# Patient Record
Sex: Male | Born: 1945 | Race: Black or African American | Hispanic: No | Marital: Married | Smoking: Former smoker
Health system: Southern US, Community
[De-identification: ages and names within clinical notes are randomized; demographics above are authoritative.]

## PROBLEM LIST (undated history)

## (undated) DIAGNOSIS — I10 Essential (primary) hypertension: Secondary | ICD-10-CM

## (undated) DIAGNOSIS — D696 Thrombocytopenia, unspecified: Secondary | ICD-10-CM

## (undated) DIAGNOSIS — J45909 Unspecified asthma, uncomplicated: Secondary | ICD-10-CM

## (undated) DIAGNOSIS — E119 Type 2 diabetes mellitus without complications: Secondary | ICD-10-CM

## (undated) DIAGNOSIS — E039 Hypothyroidism, unspecified: Secondary | ICD-10-CM

## (undated) DIAGNOSIS — M1990 Unspecified osteoarthritis, unspecified site: Secondary | ICD-10-CM

## (undated) DIAGNOSIS — E538 Deficiency of other specified B group vitamins: Secondary | ICD-10-CM

## (undated) DIAGNOSIS — A048 Other specified bacterial intestinal infections: Secondary | ICD-10-CM

## (undated) DIAGNOSIS — E785 Hyperlipidemia, unspecified: Secondary | ICD-10-CM

## (undated) DIAGNOSIS — Z86010 Personal history of colonic polyps: Secondary | ICD-10-CM

---

## 2012-04-28 HISTORY — PX: COLONOSCOPY: SHX174

## 2015-08-09 DIAGNOSIS — L02611 Cutaneous abscess of right foot: Secondary | ICD-10-CM | POA: Diagnosis not present

## 2015-11-09 DIAGNOSIS — Z8639 Personal history of other endocrine, nutritional and metabolic disease: Secondary | ICD-10-CM | POA: Diagnosis not present

## 2015-11-23 ENCOUNTER — Ambulatory Visit (INDEPENDENT_AMBULATORY_CARE_PROVIDER_SITE_OTHER): Payer: Self-pay | Admitting: Internal Medicine

## 2015-11-23 DIAGNOSIS — Z9189 Other specified personal risk factors, not elsewhere classified: Secondary | ICD-10-CM | POA: Diagnosis not present

## 2015-11-23 LAB — HEPATITIS A ANTIBODY, TOTAL: Hep A Total Ab: NONREACTIVE

## 2015-11-23 MED ORDER — TYPHOID VACCINE PO CPDR: 1.0000 | DELAYED_RELEASE_CAPSULE | ORAL | 0 refills | Status: DC

## 2015-11-23 MED ORDER — ATOVAQUONE-PROGUANIL HCL 250-100 MG PO TABS: 1.0000 | ORAL_TABLET | Freq: Every day | ORAL | 0 refills | Status: DC

## 2015-11-23 MED ORDER — AZITHROMYCIN 500 MG PO TABS: 500.0000 mg | ORAL_TABLET | Freq: Every day | ORAL | 0 refills | Status: DC

## 2015-11-23 MED FILL — AZITHROMYCIN 500 MG TABLET: 500 | 6 days supply | Qty: 6 | Fill #0

## 2015-11-23 MED FILL — ATOVAQUONE-PROGUANIL 250-10: 250-100 | 24 days supply | Qty: 24 | Fill #0

## 2015-11-23 MED FILL — VIVOTIF EC CAPSULE: 8 days supply | Qty: 4 | Fill #0

## 2015-11-23 NOTE — Progress Notes (Signed)
  RFV: pre travel counseling for [place] Subjective:    Patient ID: Jay Porter, male    DOB: 01/24/1946, 70 y.o.   MRN: [id number]  HPI 70yo M in good state of health who will be traveling from Oct 15-31st through [place] trip to [place]. Trip starts in [place], includes going to game parks.  Dr Jaci Standard has received immunization for tdap, hep a, hep b  All NKMA Meds: amlodipine, irbesartan, metformin Pmhx: htn, type 2 DM  Review of Systems     Objective:   Physical Exam        Assessment & Plan:  Pre travel counseling = recommend vivotif vaccine. Will check hep A Ab to see if immune  Traveler's diarrhea = gave rx for cipro to use if needed. Gave precautions  Malaria proph = will give malarone to start on day of arrival to [place]

## 2015-11-30 ENCOUNTER — Ambulatory Visit (INDEPENDENT_AMBULATORY_CARE_PROVIDER_SITE_OTHER): Payer: 59 | Admitting: *Deleted

## 2015-11-30 DIAGNOSIS — Z23 Encounter for immunization: Secondary | ICD-10-CM | POA: Diagnosis not present

## 2015-11-30 DIAGNOSIS — Z7189 Other specified counseling: Secondary | ICD-10-CM

## 2015-11-30 DIAGNOSIS — Z789 Other specified health status: Secondary | ICD-10-CM | POA: Diagnosis not present

## 2015-11-30 DIAGNOSIS — Z9189 Other specified personal risk factors, not elsewhere classified: Secondary | ICD-10-CM | POA: Diagnosis not present

## 2015-11-30 DIAGNOSIS — IMO0002 Reserved for concepts with insufficient information to code with codable children: Secondary | ICD-10-CM

## 2015-12-19 ENCOUNTER — Encounter (INDEPENDENT_AMBULATORY_CARE_PROVIDER_SITE_OTHER): Payer: Self-pay

## 2015-12-19 ENCOUNTER — Ambulatory Visit (HOSPITAL_COMMUNITY)
Admission: RE | Admit: 2015-12-19 | Discharge: 2015-12-19 | Disposition: A | Discharge status: Home / Self Care | Payer: 59 | Source: Ambulatory Visit | Attending: Pulmonary Disease | Admitting: Pulmonary Disease

## 2015-12-19 ENCOUNTER — Other Ambulatory Visit (HOSPITAL_COMMUNITY): Payer: Self-pay | Admitting: Pulmonary Disease

## 2015-12-19 DIAGNOSIS — M25551 Pain in right hip: Secondary | ICD-10-CM | POA: Diagnosis not present

## 2015-12-19 DIAGNOSIS — M1611 Unilateral primary osteoarthritis, right hip: Secondary | ICD-10-CM | POA: Insufficient documentation

## 2015-12-19 DIAGNOSIS — R52 Pain, unspecified: Secondary | ICD-10-CM

## 2016-01-03 DIAGNOSIS — M1611 Unilateral primary osteoarthritis, right hip: Secondary | ICD-10-CM | POA: Diagnosis not present

## 2016-02-01 DIAGNOSIS — M25551 Pain in right hip: Secondary | ICD-10-CM | POA: Diagnosis not present

## 2016-03-27 DIAGNOSIS — J302 Other seasonal allergic rhinitis: Secondary | ICD-10-CM | POA: Diagnosis not present

## 2016-03-27 DIAGNOSIS — D696 Thrombocytopenia, unspecified: Secondary | ICD-10-CM | POA: Diagnosis not present

## 2016-03-27 DIAGNOSIS — Z0000 Encounter for general adult medical examination without abnormal findings: Secondary | ICD-10-CM | POA: Diagnosis not present

## 2016-03-27 DIAGNOSIS — I10 Essential (primary) hypertension: Secondary | ICD-10-CM | POA: Diagnosis not present

## 2016-03-27 DIAGNOSIS — E538 Deficiency of other specified B group vitamins: Secondary | ICD-10-CM | POA: Diagnosis not present

## 2016-03-27 DIAGNOSIS — E119 Type 2 diabetes mellitus without complications: Secondary | ICD-10-CM | POA: Diagnosis not present

## 2016-03-28 DIAGNOSIS — E538 Deficiency of other specified B group vitamins: Secondary | ICD-10-CM | POA: Diagnosis not present

## 2016-03-28 DIAGNOSIS — D696 Thrombocytopenia, unspecified: Secondary | ICD-10-CM | POA: Diagnosis not present

## 2016-03-28 DIAGNOSIS — E119 Type 2 diabetes mellitus without complications: Secondary | ICD-10-CM | POA: Diagnosis not present

## 2016-03-28 DIAGNOSIS — Z0000 Encounter for general adult medical examination without abnormal findings: Secondary | ICD-10-CM | POA: Diagnosis not present

## 2016-03-28 DIAGNOSIS — I10 Essential (primary) hypertension: Secondary | ICD-10-CM | POA: Diagnosis not present

## 2016-05-01 MED FILL — JENTADUETO 2.5 MG-1000 MG T: 2.5-1000 | 90 days supply | Qty: 90 | Fill #0

## 2016-05-02 MED FILL — IRBESARTAN 300 MG TABLET: 300 | 90 days supply | Qty: 90 | Fill #0

## 2016-05-02 MED FILL — AMLODIPINE BESYLATE 10 MG T: 10 | 90 days supply | Qty: 90 | Fill #0

## 2016-08-11 MED FILL — AMLODIPINE BESYLATE 10 MG T: 10 | 90 days supply | Qty: 90 | Fill #1

## 2016-08-11 MED FILL — IRBESARTAN 300 MG TABLET: 300 | 60 days supply | Qty: 60 | Fill #0

## 2016-08-11 MED FILL — JENTADUETO 2.5 MG-1000 MG T: 2.5-1000 | 90 days supply | Qty: 90 | Fill #1

## 2016-10-09 DIAGNOSIS — M1611 Unilateral primary osteoarthritis, right hip: Secondary | ICD-10-CM | POA: Diagnosis not present

## 2016-10-15 MED FILL — IRBESARTAN 300 MG TABLET: 300 | 60 days supply | Qty: 60 | Fill #0

## 2016-10-23 DIAGNOSIS — E559 Vitamin D deficiency, unspecified: Secondary | ICD-10-CM | POA: Diagnosis not present

## 2016-10-23 DIAGNOSIS — E785 Hyperlipidemia, unspecified: Secondary | ICD-10-CM | POA: Diagnosis not present

## 2016-10-23 DIAGNOSIS — E119 Type 2 diabetes mellitus without complications: Secondary | ICD-10-CM | POA: Diagnosis not present

## 2016-10-23 DIAGNOSIS — I10 Essential (primary) hypertension: Secondary | ICD-10-CM | POA: Diagnosis not present

## 2016-10-28 MED FILL — VIT D2 1.25 MG (50,000 UNIT: 1.25 MG | 56 days supply | Qty: 8 | Fill #0

## 2016-11-05 MED FILL — AMLODIPINE BESYLATE 10 MG T: 10 | 90 days supply | Qty: 90 | Fill #0

## 2016-11-05 MED FILL — ROSUVASTATIN CALCIUM 5 MG T: 5 | 30 days supply | Qty: 30 | Fill #0

## 2016-11-07 DIAGNOSIS — B351 Tinea unguium: Secondary | ICD-10-CM | POA: Diagnosis not present

## 2016-11-07 DIAGNOSIS — E119 Type 2 diabetes mellitus without complications: Secondary | ICD-10-CM | POA: Diagnosis not present

## 2016-11-07 DIAGNOSIS — M2041 Other hammer toe(s) (acquired), right foot: Secondary | ICD-10-CM | POA: Diagnosis not present

## 2016-11-07 DIAGNOSIS — L602 Onychogryphosis: Secondary | ICD-10-CM | POA: Diagnosis not present

## 2016-11-27 DIAGNOSIS — Z79899 Other long term (current) drug therapy: Secondary | ICD-10-CM | POA: Diagnosis not present

## 2016-11-27 DIAGNOSIS — E785 Hyperlipidemia, unspecified: Secondary | ICD-10-CM | POA: Diagnosis not present

## 2016-12-05 NOTE — Progress Notes (Signed)
Please place orders in EPIC as patient is being scheduled for a pre-op appointment! Thank you! 

## 2016-12-08 ENCOUNTER — Ambulatory Visit: Payer: Self-pay | Admitting: Orthopedic Surgery

## 2016-12-08 MED FILL — metFORMIN HCL 500 MG TABS: 500 | 30 days supply | Qty: 90 | Fill #0

## 2016-12-08 MED FILL — IRBESARTAN 300 MG TAB: 300 | 60 days supply | Qty: 60 | Fill #0

## 2016-12-24 ENCOUNTER — Other Ambulatory Visit: Payer: Self-pay | Admitting: *Deleted

## 2016-12-24 NOTE — Patient Outreach (Signed)
[facility]) Care Management  12/24/2016  Jay Porter 11/27/1945 [id number]   Subjective: Telephone call to patient's home / mobile number, spoke with patient, and HIPAA verified.  Discussed [facility] Care Management UMR Transition of care follow up, preoperative call follow up, patient voiced understanding, and is in agreement to both types of follow up. Patient states he is doing well, will have surgery on 12/31/16, and is anticipating 1 -2 day stay.  RNCM advised patient that durable medical equipment, home health, and or outpatient rehab will be set up by inpatient case manager prior to discharge.  Patient aware that he will receive a higher benefit level by using [facility] facility for hospitalization and outpatient rehab.  States he is accessing the following [facility] benefits: outpatient pharmacy, hospital indemnity (did not choose this benefit), Advanced Directives (verbally given contact number for [facility] [phone], will call to set up appointment) and will call [employer] today to start family medical leave act (FMLA) process (verbally given contact number for [employer] [phone]). States he is very appreciative of the follow up and is in agreement to receive [place] Management information post transition of care follow up.    Objective: Per chart review, patient will be admitted on 12/31/16 for RIGHT TOTAL HIP ARTHROPLASTY ANTERIOR APPROACH.   Assessment: Received UMR Preoperative Call referral on 12/18/16. Preoperative call completed, and transition of care follow up pending hospital discharge.   Plan: RNCM will call patient for  telephone outreach attempt, transition of care follow up, within 3 business days of hospital discharge notification.   Kenza Munar H. Annia Friendly, BSN, [facility] Telephonic CM Phone: [phone] Fax: [phone]

## 2016-12-24 NOTE — Patient Instructions (Addendum)
Jay Porter  12/24/2016   Your procedure is scheduled on: 12-31-16   Report to [facility] Main  Entrance Report to Admitting at 6:00 AM   Call this number if you have problems the morning of surgery  [phone]   Remember: ONLY 1 PERSON MAY GO WITH YOU TO SHORT STAY TO GET  READY MORNING OF Jay Porter.  Do not eat food or drink liquids :After Midnight.     Take these medicines the morning of surgery with A SIP OF WATER: Amlodipine (Norvasc)  DO NOT TAKE ANY DIABETIC MEDICATIONS DAY OF YOUR SURGERY                               You may not have any metal on your body including hair pins and              piercings  Do not wear jewelry, make-up, lotions, powders or perfumes, deodorant             Men may shave face and neck.   Do not bring valuables to the hospital. [place].  Contacts, dentures or bridgework may not be worn into surgery.  Leave suitcase in the car. After surgery it may be brought to your room.                Please read over the following fact sheets you were given: _____________________________________________________________________  How to Manage Your Diabetes Before and After Surgery  Why is it important to control my blood sugar before and after surgery? . Improving blood sugar levels before and after surgery helps healing and can limit problems. . A way of improving blood sugar control is eating a healthy diet by: o  Eating less sugar and carbohydrates o  Increasing activity/exercise o  Talking with your doctor about reaching your blood sugar goals . High blood sugars (greater than 180 mg/dL) can raise your risk of infections and slow your recovery, so you will need to focus on controlling your diabetes during the weeks before surgery. . Make sure that the doctor who takes care of your diabetes knows about your planned surgery including the date and location.  How do I manage  my blood sugar before surgery? . Check your blood sugar at least 4 times a day, starting 2 days before surgery, to make sure that the level is not too high or low. o Check your blood sugar the morning of your surgery when you wake up and every 2 hours until you get to the Short Stay unit. . If your blood sugar is less than 70 mg/dL, you will need to treat for low blood sugar: o Do not take insulin. o Treat a low blood sugar (less than 70 mg/dL) with  cup of clear juice (cranberry or apple), 4 glucose tablets, OR glucose gel. o Recheck blood sugar in 15 minutes after treatment (to make sure it is greater than 70 mg/dL). If your blood sugar is not greater than 70 mg/dL on recheck, call [phone] for further instructions. . Report your blood sugar to the short stay nurse when you get to Short Stay.  . If you are admitted to the hospital after surgery: o Your blood sugar will be  checked by the staff and you will probably be given insulin after surgery (instead of oral diabetes medicines) to make sure you have good blood sugar levels. o The goal for blood sugar control after surgery is 80-180 mg/dL.   WHAT DO I DO ABOUT MY DIABETES MEDICATION?  Marland Kitchen Do not take oral diabetes medicines (pills) the morning of surgery.  . THE DAY BEFORE SURGERY, take your usual dose of Metformin.       Patient Signature:  Date:   Nurse Signature:  Date:   Reviewed and Endorsed by [facility] Patient Education Committee, August 2015           [facility] - Preparing for Surgery Before surgery, you can play an important role.  Because skin is not sterile, your skin needs to be as free of germs as possible.  You can reduce the number of germs on your skin by washing with CHG (chlorahexidine gluconate) soap before surgery.  CHG is an antiseptic cleaner which kills germs and bonds with the skin to continue killing germs even after washing. Please DO NOT use if you have an allergy to CHG or antibacterial soaps.  If  your skin becomes reddened/irritated stop using the CHG and inform your nurse when you arrive at Short Stay. Do not shave (including legs and underarms) for at least 48 hours prior to the first CHG shower.  You may shave your face/neck. Please follow these instructions carefully:  1.  Shower with CHG Soap the night before surgery and the  morning of Surgery.  2.  If you choose to wash your hair, wash your hair first as usual with your  normal  shampoo.  3.  After you shampoo, rinse your hair and body thoroughly to remove the  shampoo.                           4.  Use CHG as you would any other liquid soap.  You can apply chg directly  to the skin and wash                       Gently with a scrungie or clean washcloth.  5.  Apply the CHG Soap to your body ONLY FROM THE NECK DOWN.   Do not use on face/ open                           Wound or open sores. Avoid contact with eyes, ears mouth and genitals (private parts).                       Wash face,  Genitals (private parts) with your normal soap.             6.  Wash thoroughly, paying special attention to the area where your surgery  will be performed.  7.  Thoroughly rinse your body with warm water from the neck down.  8.  DO NOT shower/wash with your normal soap after using and rinsing off  the CHG Soap.                9.  Pat yourself dry with a clean towel.            10.  Wear clean pajamas.            11.  Place clean sheets on your bed the  night of your first shower and do not  sleep with pets. Day of Surgery : Do not apply any lotions/deodorants the morning of surgery.  Please wear clean clothes to the hospital/surgery center.  FAILURE TO FOLLOW THESE INSTRUCTIONS MAY RESULT IN THE CANCELLATION OF YOUR SURGERY PATIENT SIGNATURE_________________________________  NURSE SIGNATURE__________________________________  ________________________________________________________________________   Adam Phenix  An incentive  spirometer is a tool that can help keep your lungs clear and active. This tool measures how well you are filling your lungs with each breath. Taking long deep breaths may help reverse or decrease the chance of developing breathing (pulmonary) problems (especially infection) following:  A long period of time when you are unable to move or be active. BEFORE THE PROCEDURE   If the spirometer includes an indicator to show your best effort, your nurse or respiratory therapist will set it to a desired goal.  If possible, sit up straight or lean slightly forward. Try not to slouch.  Hold the incentive spirometer in an upright position. INSTRUCTIONS FOR USE  1. Sit on the edge of your bed if possible, or sit up as far as you can in bed or on a chair. 2. Hold the incentive spirometer in an upright position. 3. Breathe out normally. 4. Place the mouthpiece in your mouth and seal your lips tightly around it. 5. Breathe in slowly and as deeply as possible, raising the piston or the ball toward the top of the column. 6. Hold your breath for 3-5 seconds or for as long as possible. Allow the piston or ball to fall to the bottom of the column. 7. Remove the mouthpiece from your mouth and breathe out normally. 8. Rest for a few seconds and repeat Steps 1 through 7 at least 10 times every 1-2 hours when you are awake. Take your time and take a few normal breaths between deep breaths. 9. The spirometer may include an indicator to show your best effort. Use the indicator as a goal to work toward during each repetition. 10. After each set of 10 deep breaths, practice coughing to be sure your lungs are clear. If you have an incision (the cut made at the time of surgery), support your incision when coughing by placing a pillow or rolled up towels firmly against it. Once you are able to get out of bed, walk around indoors and cough well. You may stop using the incentive spirometer when instructed by your caregiver.   RISKS AND COMPLICATIONS  Take your time so you do not get dizzy or light-headed.  If you are in pain, you may need to take or ask for pain medication before doing incentive spirometry. It is harder to take a deep breath if you are having pain. AFTER USE  Rest and breathe slowly and easily.  It can be helpful to keep track of a log of your progress. Your caregiver can provide you with a simple table to help with this. If you are using the spirometer at home, follow these instructions: [place] IF:   You are having difficultly using the spirometer.  You have trouble using the spirometer as often as instructed.  Your pain medication is not giving enough relief while using the spirometer.  You develop fever of 100.5 F (38.1 C) or higher. SEEK IMMEDIATE MEDICAL CARE IF:   You cough up bloody sputum that had not been present before.  You develop fever of 102 F (38.9 C) or greater.  You develop worsening pain at or  near the incision site. MAKE SURE YOU:   Understand these instructions.  Will watch your condition.  Will get help right away if you are not doing well or get worse. Document Released: 08/25/2006 Document Revised: 07/07/2011 Document Reviewed: 10/26/2006 ExitCare Patient Information 2014 ExitCare, [place].   ________________________________________________________________________  WHAT IS A BLOOD TRANSFUSION? Blood Transfusion Information  A transfusion is the replacement of blood or some of its parts. Blood is made up of multiple cells which provide different functions.  Red blood cells carry oxygen and are used for blood loss replacement.  White blood cells fight against infection.  Platelets control bleeding.  Plasma helps clot blood.  Other blood products are available for specialized needs, such as hemophilia or other clotting disorders. BEFORE THE TRANSFUSION  Who gives blood for transfusions?   Healthy volunteers who are fully evaluated  to make sure their blood is safe. This is blood bank blood. Transfusion therapy is the safest it has ever been in the practice of medicine. Before blood is taken from a donor, a complete history is taken to make sure that person has no history of diseases nor engages in risky social behavior (examples are intravenous drug use or sexual activity with multiple partners). The donor's travel history is screened to minimize risk of transmitting infections, such as malaria. The donated blood is tested for signs of infectious diseases, such as HIV and hepatitis. The blood is then tested to be sure it is compatible with you in order to minimize the chance of a transfusion reaction. If you or a relative donates blood, this is often done in anticipation of surgery and is not appropriate for emergency situations. It takes many days to process the donated blood. RISKS AND COMPLICATIONS Although transfusion therapy is very safe and saves many lives, the main dangers of transfusion include:   Getting an infectious disease.  Developing a transfusion reaction. This is an allergic reaction to something in the blood you were given. Every precaution is taken to prevent this. The decision to have a blood transfusion has been considered carefully by your caregiver before blood is given. Blood is not given unless the benefits outweigh the risks. AFTER THE TRANSFUSION  Right after receiving a blood transfusion, you will usually feel much better and more energetic. This is especially true if your red blood cells have gotten low (anemic). The transfusion raises the level of the red blood cells which carry oxygen, and this usually causes an energy increase.  The nurse administering the transfusion will monitor you carefully for complications. HOME CARE INSTRUCTIONS  No special instructions are needed after a transfusion. You may find your energy is better. Speak with your caregiver about any limitations on activity for  underlying diseases you may have. SEEK MEDICAL CARE IF:   Your condition is not improving after your transfusion.  You develop redness or irritation at the intravenous (IV) site. SEEK IMMEDIATE MEDICAL CARE IF:  Any of the following symptoms occur over the next 12 hours:  Shaking chills.  You have a temperature by mouth above 102 F (38.9 C), not controlled by medicine.  Chest, back, or muscle pain.  People around you feel you are not acting correctly or are confused.  Shortness of breath or difficulty breathing.  Dizziness and fainting.  You get a rash or develop hives.  You have a decrease in urine output.  Your urine turns a dark color or changes to pink, red, or brown. Any of the following symptoms occur over  the next 10 days:  You have a temperature by mouth above 102 F (38.9 C), not controlled by medicine.  Shortness of breath.  Weakness after normal activity.  The white part of the eye turns yellow (jaundice).  You have a decrease in the amount of urine or are urinating less often.  Your urine turns a dark color or changes to pink, red, or brown. Document Released: 04/11/2000 Document Revised: 07/07/2011 Document Reviewed: 11/29/2007 [facility] Patient Information 2014 [place], [place].  _______________________________________________________________________

## 2016-12-24 NOTE — Progress Notes (Signed)
11-27-16 CMP on chart

## 2016-12-25 ENCOUNTER — Encounter (HOSPITAL_COMMUNITY)
Admission: RE | Admit: 2016-12-25 | Discharge: 2016-12-25 | Disposition: A | Discharge status: Home / Self Care | Payer: 59 | Source: Ambulatory Visit | Attending: Orthopedic Surgery | Admitting: Orthopedic Surgery

## 2016-12-25 ENCOUNTER — Encounter (HOSPITAL_COMMUNITY): Payer: Self-pay

## 2016-12-25 ENCOUNTER — Encounter (INDEPENDENT_AMBULATORY_CARE_PROVIDER_SITE_OTHER): Payer: Self-pay

## 2016-12-25 DIAGNOSIS — Z01818 Encounter for other preprocedural examination: Secondary | ICD-10-CM | POA: Diagnosis not present

## 2016-12-25 DIAGNOSIS — E119 Type 2 diabetes mellitus without complications: Secondary | ICD-10-CM | POA: Insufficient documentation

## 2016-12-25 DIAGNOSIS — H40013 Open angle with borderline findings, low risk, bilateral: Secondary | ICD-10-CM | POA: Diagnosis not present

## 2016-12-25 DIAGNOSIS — H25013 Cortical age-related cataract, bilateral: Secondary | ICD-10-CM | POA: Diagnosis not present

## 2016-12-25 DIAGNOSIS — H2513 Age-related nuclear cataract, bilateral: Secondary | ICD-10-CM | POA: Diagnosis not present

## 2016-12-25 HISTORY — DX: Essential (primary) hypertension: I10

## 2016-12-25 HISTORY — DX: Unspecified osteoarthritis, unspecified site: M19.90

## 2016-12-25 HISTORY — DX: Type 2 diabetes mellitus without complications: E11.9

## 2016-12-25 LAB — GLUCOSE, CAPILLARY: Glucose-Capillary: 118 mg/dL — ABNORMAL HIGH (ref 65–99)

## 2016-12-25 LAB — CBC
HCT: 43.1 % (ref 39.0–52.0)
HEMOGLOBIN: 14.6 g/dL (ref 13.0–17.0)
MCH: 32.4 pg (ref 26.0–34.0)
MCHC: 33.9 g/dL (ref 30.0–36.0)
MCV: 95.6 fL (ref 78.0–100.0)
PLATELETS: DECREASED 10*3/uL (ref 150–400)
RBC: 4.51 MIL/uL (ref 4.22–5.81)
RDW: 12.9 % (ref 11.5–15.5)
WBC: 4.8 10*3/uL (ref 4.0–10.5)

## 2016-12-25 LAB — SURGICAL PCR SCREEN
MRSA, PCR: NEGATIVE
STAPHYLOCOCCUS AUREUS: NEGATIVE

## 2016-12-25 LAB — PROTIME-INR
INR: 1
PROTHROMBIN TIME: 13.1 s (ref 11.4–15.2)

## 2016-12-25 LAB — HEMOGLOBIN A1C
Hgb A1c MFr Bld: 5.5 % (ref 4.8–5.6)
MEAN PLASMA GLUCOSE: 111.15 mg/dL

## 2016-12-25 LAB — ABO/RH: ABO/RH(D): O POS

## 2016-12-25 LAB — APTT: APTT: 30 s (ref 24–36)

## 2016-12-29 ENCOUNTER — Ambulatory Visit: Payer: Self-pay | Admitting: Orthopedic Surgery

## 2016-12-29 NOTE — H&P (Signed)
Jay Lewandowsky MD DOB: 1946-01-23 Married / Language: English / Race: Black or African American Male Date of Admission:  12/31/2016 CC:  Right Hip Pain History of Present Illness The patient is a 71 year old male who comes in  for a preoperative History and Physical. The patient is scheduled for a right total hip arthroplasty (anterior) to be performed by Dr. Dione Plover. [name], MD at [facility] on 12/31/2016. The patient is a 71 year old male who presented with a hip problem. The patient reports right hip problems including pain symptoms that have been present for more than 3 years. The symptoms began without any known injury. Symptoms reported include hip pain, night pain, stiffness, catching, difficulty flexing hip and difficulty rotating hip The patient reports symptoms radiating to the: right groin, right thigh and right knee. The patient describes the hip problem as sharp and aching. Onset of symptoms was gradual.The symptoms are described as severe.The patient feels as if their symptoms are does feel they are worsening (progressively worsening over the past year). Symptoms are relieved by nonsteroidal anti-inflammatory drugs (symptoms are relieved by Aleve). Previous workup for this problem has included hip x-rays (about a year ago at [facility] hospital). Previous treatment for this problem has included an intra-articular corticosteroid injection by Dr. Jefferson Fuel at [facility]. He states that he did not really notice any benefit with the injection. For the past year, he has had increased pain. He was getting ready to go on a trip to [place] last year and had intraarticular injection from Dr. Jefferson Fuel at [facility], but it did not provide much benefit. [name] states that the hip is getting progressive worse now. It is hurting with most activities. He is even getting some pain at night. 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 The pain is in his groin radiating down towards the knee. He is not having any lower extremity weakness or paresthesia with this. He definitely notes significant functional limitations. AP pelvis and lateral the right hip show that he has bone-on-bone arthritis in the right hip with large subchondral cyst. This has worsened compared to x-rays he had last fall. He has got advanced end-stage arthritis right hip with progressively worsening pain and dysfunction. At this point, the most predictable means of improving pain and function is total hip arthroplasty. The procedure, risks, potential complications and rehab course are discussed in detail and the patient elects to proceed. They have been treated conservatively in the past for the above stated problem and despite conservative measures, they continue to have progressive pain and severe functional limitations and dysfunction. They have failed non-operative management including home exercise, medications, and injections. It is felt that they would benefit from undergoing total joint replacement. Risks and benefits of the procedure have been discussed with the patient and they elect to proceed with surgery. There are no active contraindications to surgery such as ongoing infection or rapidly progressive neurological disease.    Problem List/Past Medical Abscess of great toe, right (L02.611)  Primary osteoarthritis of right hip (M16.11)  Diabetes Mellitus, Type II  High blood pressure    Allergies  No Known Drug Allergies   Family History  Cancer  Mother. Diabetes Mellitus  Mother, Sister. Hypertension  Mother. Kidney disease  Father, Mother. Osteoarthritis  Mother, Sister.  Social History Children  2 Current drinker  08/09/2015: Currently drinks wine less than 5 times per week Current  work status  working full time Exercise  Light, daily, 15 - 20 minutes, running/walking. Exercises weekly; does running /  walking Living situation  live with spouse Marital status  married No history of drug/alcohol rehab  Not under pain contract  Number of flights of stairs before winded  4-5 Tobacco / smoke exposure  08/09/2015: no Tobacco use  Never smoker. 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  Medication History Aleve (220MG  Tablet, Oral) Active. Vitamin D (Oral) Specific strength unknown - Active. Rosuvastatin Calcium (5MG  Tablet, Oral) Active. MetFORMIN HCl (Oral) Specific strength unknown - Active. Linagliptin (Oral) Specific strength unknown - Active. AmLODIPine Besylate (Oral) Specific strength unknown - Active. Irbesartan (300MG  Tablet, Oral) Active.  Past Surgical History Colon Polyp Removal - Colonoscopy    Review of Systems  General Not Present- Chills, Fatigue, Fever, Memory Loss, Night Sweats, Weight Gain and Weight Loss. Skin Not Present- Eczema, Hives, Itching, Lesions and Rash. HEENT Not Present- Dentures, Double Vision, Headache, Hearing Loss, Tinnitus and Visual Loss. Respiratory Not Present- Allergies, Chronic Cough, Coughing up blood, Shortness of breath at rest and Shortness of breath with exertion. Cardiovascular Not Present- Chest Pain, Difficulty Breathing Lying Down, Murmur, Palpitations, Racing/skipping heartbeats and Swelling. Gastrointestinal Not Present- Abdominal Pain, Bloody Stool, Constipation, Diarrhea, Difficulty Swallowing, Heartburn, Jaundice, Loss of appetitie, Nausea and Vomiting. Male Genitourinary Not Present- Blood in Urine, Discharge, Flank Pain, Incontinence, Painful Urination, Urgency, Urinary frequency, Urinary Retention, Urinating at Night and Weak urinary stream. Musculoskeletal Present- Joint Pain. Not Present- Back Pain, Joint Swelling, Morning Stiffness, Muscle Pain, Muscle Weakness and Spasms. Neurological Not Present- Blackout spells,  Difficulty with balance, Dizziness, Paralysis, Tremor and Weakness. Psychiatric Not Present- Insomnia.  Vitals  Weight: 170 lb Height: 66in Body Surface Area: 1.87 m Body Mass Index: 27.44 kg/m  Pulse: 64 (Regular)  Resp.: 14 (Unlabored)  BP: 126/68 (Sitting, Right Arm, Standard)    Physical Exam General Mental Status -Alert, cooperative and good historian. General Appearance-pleasant, Not in acute distress. Orientation-Oriented X3. Build & Nutrition-Well nourished and Well developed.  Head and Neck Head-normocephalic, atraumatic . Neck Global Assessment - supple, no bruit auscultated on the right, no bruit auscultated on the left.  Eye Vision-Wears corrective lenses(does not have on today during the exam). Pupil - Bilateral-Regular and Round. Motion - Bilateral-EOMI.  Chest and Lung Exam Auscultation Breath sounds - clear at anterior chest wall and clear at posterior chest wall. Adventitious sounds - No Adventitious sounds.  Cardiovascular Auscultation Rhythm - Regular rate and rhythm. Heart Sounds - S1 WNL and S2 WNL. Murmurs & Other Heart Sounds - Auscultation of the heart reveals - No Murmurs.  Abdomen Palpation/Percussion Tenderness - Abdomen is non-tender to palpation. Rigidity (guarding) - Abdomen is soft. Auscultation Auscultation of the abdomen reveals - Bowel sounds normal.  Male Genitourinary Note: Not done, not pertinent to present illness   Musculoskeletal Note: His left hip can be flexed to 120, rotated in 30, out 40, abducted 40 without discomfort. Right hip flexion 110, rotation in 5, out 20, abduction 30 with discomfort. Knee exam is normal bilaterally. Pulse, sensation and motor intact both lower extremities. Gait pattern is antalgic on the right.  RADIOGRAPHS AP pelvis and lateral the right hip show that he has bone-on-bone arthritis in the right hip with large subchondral cyst. This has worsened compared to x-rays  he had last fall.   Assessment & Plan  Primary osteoarthritis of right hip (M16.11)  Note:Surgical Plans: Right Total Hip Replacement - Anterior Approach  Disposition: Home, HHPT  PCP: Dr. Lunette Stands  IV TXA  Anesthesia Issues: None  Patient was instructed on what medications to stop prior to surgery.  Signed electronically by Joelene Millin, III PA-C

## 2016-12-30 NOTE — Anesthesia Preprocedure Evaluation (Addendum)
Anesthesia Evaluation  Patient identified by MRN, date of birth, ID band Patient awake    Reviewed: Allergy & Precautions, NPO status , Patient's Chart, lab work & pertinent test results  Airway Mallampati: II  TM Distance: >3 FB Neck ROM: Full    Dental no notable dental hx.    Pulmonary neg pulmonary ROS, former smoker,    Pulmonary exam normal breath sounds clear to auscultation       Cardiovascular hypertension, Normal cardiovascular exam Rhythm:Regular Rate:Normal     Neuro/Psych negative neurological ROS  negative psych ROS   GI/Hepatic negative GI ROS, Neg liver ROS,   Endo/Other  diabetes  Renal/GU negative Renal ROS  negative genitourinary   Musculoskeletal negative musculoskeletal ROS (+)   Abdominal   Peds negative pediatric ROS (+)  Hematology negative hematology ROS (+)   Anesthesia Other Findings   Reproductive/Obstetrics negative OB ROS                             Anesthesia Physical Anesthesia Plan  ASA: II  Anesthesia Plan: Spinal   Post-op Pain Management:    Induction: Intravenous  PONV Risk Score and Plan: 1 and Ondansetron and Dexamethasone  Airway Management Planned: Simple Face Mask  Additional Equipment:   Intra-op Plan:   Post-operative Plan:   Informed Consent: I have reviewed the patients History and Physical, chart, labs and discussed the procedure including the risks, benefits and alternatives for the proposed anesthesia with the patient or authorized representative who has indicated his/her understanding and acceptance.   Dental advisory given  Plan Discussed with: CRNA and Surgeon  Anesthesia Plan Comments:         Anesthesia Quick Evaluation

## 2016-12-31 ENCOUNTER — Inpatient Hospital Stay (HOSPITAL_COMMUNITY): Payer: 59

## 2016-12-31 ENCOUNTER — Encounter (HOSPITAL_COMMUNITY)
Admission: RE | Disposition: A | Discharge status: Home Health | Payer: Self-pay | Source: Ambulatory Visit | Attending: Orthopedic Surgery

## 2016-12-31 ENCOUNTER — Inpatient Hospital Stay (HOSPITAL_COMMUNITY): Payer: 59 | Admitting: Anesthesiology

## 2016-12-31 ENCOUNTER — Encounter (HOSPITAL_COMMUNITY): Payer: Self-pay | Admitting: Orthopedic Surgery

## 2016-12-31 ENCOUNTER — Inpatient Hospital Stay (HOSPITAL_COMMUNITY)
Admission: RE | Admit: 2016-12-31 | Discharge: 2017-01-01 | DRG: 470 | Disposition: A | Discharge status: Home Health | Payer: 59 | Source: Ambulatory Visit | Attending: Orthopedic Surgery | Admitting: Orthopedic Surgery

## 2016-12-31 DIAGNOSIS — M169 Osteoarthritis of hip, unspecified: Secondary | ICD-10-CM | POA: Diagnosis not present

## 2016-12-31 DIAGNOSIS — Z96641 Presence of right artificial hip joint: Secondary | ICD-10-CM | POA: Diagnosis not present

## 2016-12-31 DIAGNOSIS — M1611 Unilateral primary osteoarthritis, right hip: Principal | ICD-10-CM | POA: Diagnosis present

## 2016-12-31 DIAGNOSIS — Z87891 Personal history of nicotine dependence: Secondary | ICD-10-CM | POA: Diagnosis not present

## 2016-12-31 DIAGNOSIS — Z7984 Long term (current) use of oral hypoglycemic drugs: Secondary | ICD-10-CM

## 2016-12-31 DIAGNOSIS — Z96649 Presence of unspecified artificial hip joint: Secondary | ICD-10-CM

## 2016-12-31 DIAGNOSIS — I10 Essential (primary) hypertension: Secondary | ICD-10-CM | POA: Diagnosis present

## 2016-12-31 DIAGNOSIS — M25551 Pain in right hip: Secondary | ICD-10-CM | POA: Diagnosis present

## 2016-12-31 DIAGNOSIS — Z79899 Other long term (current) drug therapy: Secondary | ICD-10-CM | POA: Diagnosis not present

## 2016-12-31 DIAGNOSIS — E119 Type 2 diabetes mellitus without complications: Secondary | ICD-10-CM | POA: Diagnosis present

## 2016-12-31 DIAGNOSIS — Z471 Aftercare following joint replacement surgery: Secondary | ICD-10-CM | POA: Diagnosis not present

## 2016-12-31 HISTORY — PX: TOTAL HIP ARTHROPLASTY: SHX124

## 2016-12-31 LAB — TYPE AND SCREEN
ABO/RH(D): O POS
Antibody Screen: NEGATIVE

## 2016-12-31 LAB — COMPREHENSIVE METABOLIC PANEL
ALBUMIN: 3.3 g/dL — AB (ref 3.5–5.0)
ALT: 25 U/L (ref 17–63)
AST: 26 U/L (ref 15–41)
Alkaline Phosphatase: 62 U/L (ref 38–126)
Anion gap: 6 (ref 5–15)
BUN: 19 mg/dL (ref 6–20)
CHLORIDE: 106 mmol/L (ref 101–111)
CO2: 26 mmol/L (ref 22–32)
CREATININE: 0.76 mg/dL (ref 0.61–1.24)
Calcium: 8.3 mg/dL — ABNORMAL LOW (ref 8.9–10.3)
GFR calc non Af Amer: >60 mL/min (ref >60)
GLUCOSE: 185 mg/dL — AB (ref 65–99)
Potassium: 3.8 mmol/L (ref 3.5–5.1)
SODIUM: 138 mmol/L (ref 135–145)
Total Bilirubin: 0.7 mg/dL (ref 0.3–1.2)
Total Protein: 6 g/dL — ABNORMAL LOW (ref 6.5–8.1)

## 2016-12-31 LAB — GLUCOSE, CAPILLARY
GLUCOSE-CAPILLARY: 226 mg/dL — AB (ref 65–99)
Glucose-Capillary: 93 mg/dL (ref 65–99)
Glucose-Capillary: 115 mg/dL — ABNORMAL HIGH (ref 65–99)
Glucose-Capillary: 176 mg/dL — ABNORMAL HIGH (ref 65–99)
Glucose-Capillary: 184 mg/dL — ABNORMAL HIGH (ref 65–99)

## 2016-12-31 SURGERY — ARTHROPLASTY, HIP, TOTAL, ANTERIOR APPROACH
Anesthesia: Spinal | Site: Hip | Laterality: Right

## 2016-12-31 MED ORDER — SODIUM CHLORIDE 0.9 % IV SOLN
INTRAVENOUS | Status: DC
  Administered 2016-12-31: 11:00:00 via INTRAVENOUS

## 2016-12-31 MED ORDER — LACTATED RINGERS IV SOLN
INTRAVENOUS | Status: DC
  Administered 2016-12-31: 1000 mL via INTRAVENOUS

## 2016-12-31 MED ORDER — IRBESARTAN 150 MG PO TABS
300.0000 mg | ORAL_TABLET | Freq: Every day | ORAL | Status: DC
  Administered 2017-01-01: 300 mg via ORAL
  Filled 2016-12-31: qty 2

## 2016-12-31 MED ORDER — FENTANYL CITRATE (PF) 100 MCG/2ML IJ SOLN
INTRAMUSCULAR | Status: DC | PRN
  Administered 2016-12-31: 50 ug via INTRAVENOUS
  Administered 2016-12-31 (×2): 25 ug via INTRAVENOUS

## 2016-12-31 MED ORDER — POLYETHYLENE GLYCOL 3350 17 G PO PACK: 17.0000 g | PACK | Freq: Every day | ORAL | Status: DC | PRN

## 2016-12-31 MED ORDER — METHOCARBAMOL 500 MG PO TABS
500.0000 mg | ORAL_TABLET | Freq: Four times a day (QID) | ORAL | Status: DC | PRN
  Administered 2016-12-31: 500 mg via ORAL
  Filled 2016-12-31 (×2): qty 1

## 2016-12-31 MED ORDER — DEXAMETHASONE SODIUM PHOSPHATE 10 MG/ML IJ SOLN
10.0000 mg | Freq: Once | INTRAMUSCULAR | Status: AC
  Administered 2016-12-31: 10 mg via INTRAVENOUS

## 2016-12-31 MED ORDER — DEXAMETHASONE SODIUM PHOSPHATE 10 MG/ML IJ SOLN
10.0000 mg | Freq: Once | INTRAMUSCULAR | Status: AC
  Administered 2017-01-01: 10 mg via INTRAVENOUS
  Filled 2016-12-31: qty 1

## 2016-12-31 MED ORDER — CEFAZOLIN SODIUM-DEXTROSE 2-4 GM/100ML-% IV SOLN
2.0000 g | INTRAVENOUS | Status: AC
  Administered 2016-12-31: 2 g via INTRAVENOUS

## 2016-12-31 MED ORDER — HYDROMORPHONE HCL-NACL 0.5-0.9 MG/ML-% IV SOSY
PREFILLED_SYRINGE | INTRAVENOUS | Status: AC
  Administered 2016-12-31: 0.5 mg via INTRAVENOUS
  Filled 2016-12-31: qty 4

## 2016-12-31 MED ORDER — CEFAZOLIN SODIUM-DEXTROSE 2-4 GM/100ML-% IV SOLN
2.0000 g | Freq: Four times a day (QID) | INTRAVENOUS | Status: AC
  Administered 2016-12-31 (×2): 2 g via INTRAVENOUS
  Filled 2016-12-31: qty 100

## 2016-12-31 MED ORDER — EPHEDRINE 5 MG/ML INJ
INTRAVENOUS | Status: AC
  Filled 2016-12-31: qty 10

## 2016-12-31 MED ORDER — ACETAMINOPHEN 650 MG RE SUPP: 650.0000 mg | Freq: Four times a day (QID) | RECTAL | Status: DC | PRN

## 2016-12-31 MED ORDER — BISACODYL 10 MG RE SUPP: 10.0000 mg | Freq: Every day | RECTAL | Status: DC | PRN

## 2016-12-31 MED ORDER — TRANEXAMIC ACID 1000 MG/10ML IV SOLN
1000.0000 mg | INTRAVENOUS | Status: AC
  Administered 2016-12-31: 1000 mg via INTRAVENOUS
  Filled 2016-12-31: qty 1100

## 2016-12-31 MED ORDER — PHENYLEPHRINE HCL 10 MG/ML IJ SOLN
INTRAVENOUS | Status: DC | PRN
  Administered 2016-12-31: 25 ug/min via INTRAVENOUS

## 2016-12-31 MED ORDER — METOCLOPRAMIDE HCL 5 MG PO TABS: 5.0000 mg | ORAL_TABLET | Freq: Three times a day (TID) | ORAL | Status: DC | PRN

## 2016-12-31 MED ORDER — DOCUSATE SODIUM 100 MG PO CAPS
100.0000 mg | ORAL_CAPSULE | Freq: Two times a day (BID) | ORAL | Status: DC
  Administered 2016-12-31 – 2017-01-01 (×2): 100 mg via ORAL
  Filled 2016-12-31 (×2): qty 1

## 2016-12-31 MED ORDER — METHOCARBAMOL 1000 MG/10ML IJ SOLN
500.0000 mg | Freq: Four times a day (QID) | INTRAVENOUS | Status: DC | PRN
  Administered 2016-12-31: 500 mg via INTRAVENOUS
  Filled 2016-12-31: qty 550

## 2016-12-31 MED ORDER — PHENYLEPHRINE HCL 10 MG/ML IJ SOLN
INTRAMUSCULAR | Status: AC
  Filled 2016-12-31: qty 2

## 2016-12-31 MED ORDER — LIDOCAINE 2% (20 MG/ML) 5 ML SYRINGE
INTRAMUSCULAR | Status: AC
  Filled 2016-12-31: qty 5

## 2016-12-31 MED ORDER — CEFAZOLIN SODIUM-DEXTROSE 2-4 GM/100ML-% IV SOLN
INTRAVENOUS | Status: AC
  Filled 2016-12-31: qty 100

## 2016-12-31 MED ORDER — RIVAROXABAN 10 MG PO TABS
10.0000 mg | ORAL_TABLET | Freq: Every day | ORAL | Status: DC
  Administered 2017-01-01: 10 mg via ORAL
  Filled 2016-12-31: qty 1

## 2016-12-31 MED ORDER — ACETAMINOPHEN 10 MG/ML IV SOLN
INTRAVENOUS | Status: AC
  Filled 2016-12-31: qty 100

## 2016-12-31 MED ORDER — ROSUVASTATIN CALCIUM 5 MG PO TABS
5.0000 mg | ORAL_TABLET | Freq: Every day | ORAL | Status: DC
  Administered 2016-12-31: 5 mg via ORAL
  Filled 2016-12-31: qty 1

## 2016-12-31 MED ORDER — TRANEXAMIC ACID 1000 MG/10ML IV SOLN
1000.0000 mg | Freq: Once | INTRAVENOUS | Status: AC
  Administered 2016-12-31: 1000 mg via INTRAVENOUS
  Filled 2016-12-31: qty 1100

## 2016-12-31 MED ORDER — DIPHENHYDRAMINE HCL 12.5 MG/5ML PO ELIX: 12.5000 mg | ORAL_SOLUTION | ORAL | Status: DC | PRN

## 2016-12-31 MED ORDER — BUPIVACAINE HCL (PF) 0.25 % IJ SOLN
INTRAMUSCULAR | Status: DC | PRN
  Administered 2016-12-31: 30 mL

## 2016-12-31 MED ORDER — PHENOL 1.4 % MT LIQD
1.0000 | OROMUCOSAL | Status: DC | PRN
  Filled 2016-12-31: qty 177

## 2016-12-31 MED ORDER — LIDOCAINE 2% (20 MG/ML) 5 ML SYRINGE
INTRAMUSCULAR | Status: DC | PRN
  Administered 2016-12-31: 100 mg via INTRAVENOUS

## 2016-12-31 MED ORDER — ACETAMINOPHEN 325 MG PO TABS
650.0000 mg | ORAL_TABLET | Freq: Four times a day (QID) | ORAL | Status: DC | PRN
Start: 2017-01-01 — End: 2017-01-01

## 2016-12-31 MED ORDER — BUPIVACAINE IN DEXTROSE 0.75-8.25 % IT SOLN
INTRATHECAL | Status: DC | PRN
  Administered 2016-12-31: 2 mL via INTRATHECAL

## 2016-12-31 MED ORDER — EPHEDRINE SULFATE-NACL 50-0.9 MG/10ML-% IV SOSY
PREFILLED_SYRINGE | INTRAVENOUS | Status: DC | PRN
  Administered 2016-12-31: 10 mg via INTRAVENOUS
  Administered 2016-12-31: 5 mg via INTRAVENOUS
  Administered 2016-12-31: 10 mg via INTRAVENOUS

## 2016-12-31 MED ORDER — ACETAMINOPHEN 500 MG PO TABS
1000.0000 mg | ORAL_TABLET | Freq: Four times a day (QID) | ORAL | Status: AC
  Administered 2016-12-31 – 2017-01-01 (×4): 1000 mg via ORAL
  Filled 2016-12-31 (×4): qty 2

## 2016-12-31 MED ORDER — ONDANSETRON HCL 4 MG/2ML IJ SOLN
INTRAMUSCULAR | Status: DC | PRN
  Administered 2016-12-31: 4 mg via INTRAVENOUS

## 2016-12-31 MED ORDER — LIP MEDEX EX OINT
TOPICAL_OINTMENT | CUTANEOUS | Status: AC
  Administered 2016-12-31: 14:00:00
  Filled 2016-12-31: qty 7

## 2016-12-31 MED ORDER — 0.9 % SODIUM CHLORIDE (POUR BTL) OPTIME
TOPICAL | Status: DC | PRN
Start: 2016-12-31 — End: 2016-12-31
  Administered 2016-12-31: 1000 mL

## 2016-12-31 MED ORDER — FLEET ENEMA 7-19 GM/118ML RE ENEM: 1.0000 | ENEMA | Freq: Once | RECTAL | Status: DC | PRN

## 2016-12-31 MED ORDER — ONDANSETRON HCL 4 MG PO TABS: 4.0000 mg | ORAL_TABLET | Freq: Four times a day (QID) | ORAL | Status: DC | PRN

## 2016-12-31 MED ORDER — DEXAMETHASONE SODIUM PHOSPHATE 10 MG/ML IJ SOLN
INTRAMUSCULAR | Status: AC
  Filled 2016-12-31: qty 1

## 2016-12-31 MED ORDER — INSULIN ASPART 100 UNIT/ML ~~LOC~~ SOLN
0.0000 [IU] | Freq: Three times a day (TID) | SUBCUTANEOUS | Status: DC
  Administered 2016-12-31: 5 [IU] via SUBCUTANEOUS
  Administered 2016-12-31 – 2017-01-01 (×2): 3 [IU] via SUBCUTANEOUS

## 2016-12-31 MED ORDER — PROPOFOL 10 MG/ML IV BOLUS
INTRAVENOUS | Status: AC
  Filled 2016-12-31: qty 40

## 2016-12-31 MED ORDER — FENTANYL CITRATE (PF) 100 MCG/2ML IJ SOLN
INTRAMUSCULAR | Status: AC
  Filled 2016-12-31: qty 2

## 2016-12-31 MED ORDER — MORPHINE SULFATE (PF) 2 MG/ML IV SOLN
1.0000 mg | INTRAVENOUS | Status: DC | PRN
  Administered 2016-12-31: 1 mg via INTRAVENOUS
  Filled 2016-12-31: qty 1

## 2016-12-31 MED ORDER — MIDAZOLAM HCL 2 MG/2ML IJ SOLN
INTRAMUSCULAR | Status: AC
  Filled 2016-12-31: qty 2

## 2016-12-31 MED ORDER — ONDANSETRON HCL 4 MG/2ML IJ SOLN
4.0000 mg | Freq: Four times a day (QID) | INTRAMUSCULAR | Status: DC | PRN
  Administered 2016-12-31: 4 mg via INTRAVENOUS
  Filled 2016-12-31: qty 2

## 2016-12-31 MED ORDER — MIDAZOLAM HCL 5 MG/5ML IJ SOLN
INTRAMUSCULAR | Status: DC | PRN
  Administered 2016-12-31: 2 mg via INTRAVENOUS

## 2016-12-31 MED ORDER — METOCLOPRAMIDE HCL 5 MG/ML IJ SOLN: 5.0000 mg | Freq: Three times a day (TID) | INTRAMUSCULAR | Status: DC | PRN

## 2016-12-31 MED ORDER — CHLORHEXIDINE GLUCONATE 4 % EX LIQD: 60.0000 mL | Freq: Once | CUTANEOUS | Status: DC

## 2016-12-31 MED ORDER — BUPIVACAINE HCL (PF) 0.25 % IJ SOLN
INTRAMUSCULAR | Status: AC
  Filled 2016-12-31: qty 30

## 2016-12-31 MED ORDER — HYDROMORPHONE HCL-NACL 0.5-0.9 MG/ML-% IV SOSY
0.2500 mg | PREFILLED_SYRINGE | INTRAVENOUS | Status: DC | PRN
  Administered 2016-12-31 (×2): 0.5 mg via INTRAVENOUS

## 2016-12-31 MED ORDER — OXYCODONE HCL 5 MG PO TABS
5.0000 mg | ORAL_TABLET | ORAL | Status: DC | PRN
  Administered 2016-12-31 – 2017-01-01 (×5): 10 mg via ORAL
  Filled 2016-12-31 (×5): qty 2

## 2016-12-31 MED ORDER — PROPOFOL 500 MG/50ML IV EMUL
INTRAVENOUS | Status: DC | PRN
  Administered 2016-12-31: 40 ug/kg/min via INTRAVENOUS

## 2016-12-31 MED ORDER — MENTHOL 3 MG MT LOZG: 1.0000 | LOZENGE | OROMUCOSAL | Status: DC | PRN

## 2016-12-31 MED ORDER — AMLODIPINE BESYLATE 10 MG PO TABS
10.0000 mg | ORAL_TABLET | Freq: Every day | ORAL | Status: DC
  Administered 2017-01-01: 10 mg via ORAL
  Filled 2016-12-31: qty 1

## 2016-12-31 MED ORDER — ONDANSETRON HCL 4 MG/2ML IJ SOLN
INTRAMUSCULAR | Status: AC
  Filled 2016-12-31: qty 2

## 2016-12-31 MED ORDER — ACETAMINOPHEN 10 MG/ML IV SOLN
1000.0000 mg | Freq: Once | INTRAVENOUS | Status: AC
Start: 2016-12-31 — End: 2016-12-31
  Administered 2016-12-31: 1000 mg via INTRAVENOUS

## 2016-12-31 MED ORDER — PROMETHAZINE HCL 25 MG/ML IJ SOLN: 6.2500 mg | INTRAMUSCULAR | Status: DC | PRN

## 2016-12-31 MED ORDER — PROPOFOL 10 MG/ML IV BOLUS
INTRAVENOUS | Status: DC | PRN
  Administered 2016-12-31: 160 mg via INTRAVENOUS

## 2016-12-31 SURGICAL SUPPLY — 33 items
BAG DECANTER FOR FLEXI CONT (MISCELLANEOUS) ×2 IMPLANT
BAG SPEC THK2 15X12 ZIP CLS (MISCELLANEOUS)
BAG ZIPLOCK 12X15 (MISCELLANEOUS) IMPLANT
BLADE SAG 18X100X1.27 (BLADE) ×2 IMPLANT
CAPT HIP TOTAL 2 ×2 IMPLANT
CLOTH BEACON ORANGE TIMEOUT ST (SAFETY) ×2 IMPLANT
COVER PERINEAL POST (MISCELLANEOUS) ×2 IMPLANT
DECANTER SPIKE VIAL GLASS SM (MISCELLANEOUS) ×2 IMPLANT
DRAPE STERI IOBAN 125X83 (DRAPES) ×2 IMPLANT
DRAPE U-SHAPE 47X51 STRL (DRAPES) ×4 IMPLANT
DRSG ADAPTIC 3X8 NADH LF (GAUZE/BANDAGES/DRESSINGS) ×2 IMPLANT
DRSG MEPILEX BORDER 4X4 (GAUZE/BANDAGES/DRESSINGS) ×2 IMPLANT
DRSG MEPILEX BORDER 4X8 (GAUZE/BANDAGES/DRESSINGS) ×2 IMPLANT
DURAPREP 26ML APPLICATOR (WOUND CARE) ×2 IMPLANT
ELECT REM PT RETURN 15FT ADLT (MISCELLANEOUS) ×2 IMPLANT
EVACUATOR 1/8 PVC DRAIN (DRAIN) ×2 IMPLANT
GLOVE BIO SURGEON STRL SZ7.5 (GLOVE) ×2 IMPLANT
GLOVE BIO SURGEON STRL SZ8 (GLOVE) ×4 IMPLANT
GLOVE BIOGEL PI IND STRL 8 (GLOVE) ×2 IMPLANT
GLOVE BIOGEL PI INDICATOR 8 (GLOVE) ×2
GOWN STRL REUS W/TWL LRG LVL3 (GOWN DISPOSABLE) ×2 IMPLANT
GOWN STRL REUS W/TWL XL LVL3 (GOWN DISPOSABLE) ×2 IMPLANT
PACK ANTERIOR HIP CUSTOM (KITS) ×2 IMPLANT
STRIP CLOSURE SKIN 1/2X4 (GAUZE/BANDAGES/DRESSINGS) ×2 IMPLANT
SUT ETHIBOND NAB CT1 #1 30IN (SUTURE) ×2 IMPLANT
SUT MNCRL AB 4-0 PS2 18 (SUTURE) ×2 IMPLANT
SUT STRATAFIX 0 PDS 27 VIOLET (SUTURE) ×2
SUT VIC AB 2-0 CT1 27 (SUTURE) ×4
SUT VIC AB 2-0 CT1 TAPERPNT 27 (SUTURE) ×2 IMPLANT
SUTURE STRATFX 0 PDS 27 VIOLET (SUTURE) ×1 IMPLANT
SYR 50ML LL SCALE MARK (SYRINGE) IMPLANT
TRAY FOLEY W/METER SILVER 16FR (SET/KITS/TRAYS/PACK) ×2 IMPLANT
YANKAUER SUCT BULB TIP 10FT TU (MISCELLANEOUS) ×2 IMPLANT

## 2016-12-31 NOTE — H&P (View-Only) (Signed)
Jay Lewandowsky MD DOB: 1945/07/15 Married / Language: English / Race: Black or African American Male Date of Admission:  12/31/2016 CC:  Right Hip Pain History of Present Illness The patient is a 71 year old male who comes in  for a preoperative History and Physical. The patient is scheduled for a right total hip arthroplasty (anterior) to be performed by Dr. Dione Plover. [name], MD at [facility] on 12/31/2016. The patient is a 70 year old male who presented with a hip problem. The patient reports right hip problems including pain symptoms that have been present for more than 3 years. The symptoms began without any known injury. Symptoms reported include hip pain, night pain, stiffness, catching, difficulty flexing hip and difficulty rotating hip The patient reports symptoms radiating to the: right groin, right thigh and right knee. The patient describes the hip problem as sharp and aching. Onset of symptoms was gradual.The symptoms are described as severe.The patient feels as if their symptoms are does feel they are worsening (progressively worsening over the past year). Symptoms are relieved by nonsteroidal anti-inflammatory drugs (symptoms are relieved by Aleve). Previous workup for this problem has included hip x-rays (about a year ago at [facility] hospital). Previous treatment for this problem has included an intra-articular corticosteroid injection by Dr. Jefferson Fuel at [facility]. He states that he did not really notice any benefit with the injection. For the past year, he has had increased pain. He was getting ready to go on a trip to [place] last year and had intraarticular injection from Dr. Jefferson Fuel at [facility], but it did not provide much benefit. Jay Porter states that the hip is getting progressive worse now. It is hurting with most activities. He is even getting some pain at night. 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 The pain is in his groin radiating down towards the knee. He is not having any lower extremity weakness or paresthesia with this. He definitely notes significant functional limitations. AP pelvis and lateral the right hip show that he has bone-on-bone arthritis in the right hip with large subchondral cyst. This has worsened compared to x-rays he had last fall. He has got advanced end-stage arthritis right hip with progressively worsening pain and dysfunction. At this point, the most predictable means of improving pain and function is total hip arthroplasty. The procedure, risks, potential complications and rehab course are discussed in detail and the patient elects to proceed. They have been treated conservatively in the past for the above stated problem and despite conservative measures, they continue to have progressive pain and severe functional limitations and dysfunction. They have failed non-operative management including home exercise, medications, and injections. It is felt that they would benefit from undergoing total joint replacement. Risks and benefits of the procedure have been discussed with the patient and they elect to proceed with surgery. There are no active contraindications to surgery such as ongoing infection or rapidly progressive neurological disease.    Problem List/Past Medical Abscess of great toe, right (L02.611)  Primary osteoarthritis of right hip (M16.11)  Diabetes Mellitus, Type II  High blood pressure    Allergies  No Known Drug Allergies   Family History  Cancer  Mother. Diabetes Mellitus  Mother, Sister. Hypertension  Mother. Kidney disease  Father, Mother. Osteoarthritis  Mother, Sister.  Social History Children  2 Current drinker  08/09/2015: Currently drinks wine less than 5 times per week Current  work status  working full time Exercise  Light, daily, 15 - 20 minutes, running/walking. Exercises weekly; does running /  walking Living situation  live with spouse Marital status  married No history of drug/alcohol rehab  Not under pain contract  Number of flights of stairs before winded  4-5 Tobacco / smoke exposure  08/09/2015: no Tobacco use  Never smoker. 08/09/2015 Current occupation  Physician Post-Surgical Plans  Home with [employer]. Catering manager.  Medication History Aleve (220MG  Tablet, Oral) Active. Vitamin D (Oral) Specific strength unknown - Active. Rosuvastatin Calcium (5MG  Tablet, Oral) Active. MetFORMIN HCl (Oral) Specific strength unknown - Active. Linagliptin (Oral) Specific strength unknown - Active. AmLODIPine Besylate (Oral) Specific strength unknown - Active. Irbesartan (300MG  Tablet, Oral) Active.  Past Surgical History Colon Polyp Removal - Colonoscopy    Review of Systems  General Not Present- Chills, Fatigue, Fever, Memory Loss, Night Sweats, Weight Gain and Weight Loss. Skin Not Present- Eczema, Hives, Itching, Lesions and Rash. HEENT Not Present- Dentures, Double Vision, Headache, Hearing Loss, Tinnitus and Visual Loss. Respiratory Not Present- Allergies, Chronic Cough, Coughing up blood, Shortness of breath at rest and Shortness of breath with exertion. Cardiovascular Not Present- Chest Pain, Difficulty Breathing Lying Down, Murmur, Palpitations, Racing/skipping heartbeats and Swelling. Gastrointestinal Not Present- Abdominal Pain, Bloody Stool, Constipation, Diarrhea, Difficulty Swallowing, Heartburn, Jaundice, Loss of appetitie, Nausea and Vomiting. Male Genitourinary Not Present- Blood in Urine, Discharge, Flank Pain, Incontinence, Painful Urination, Urgency, Urinary frequency, Urinary Retention, Urinating at Night and Weak urinary stream. Musculoskeletal Present- Joint Pain. Not Present- Back Pain, Joint Swelling, Morning Stiffness, Muscle Pain, Muscle Weakness and Spasms. Neurological Not Present- Blackout spells,  Difficulty with balance, Dizziness, Paralysis, Tremor and Weakness. Psychiatric Not Present- Insomnia.  Vitals  Weight: 170 lb Height: 66in Body Surface Area: 1.87 m Body Mass Index: 27.44 kg/m  Pulse: 64 (Regular)  Resp.: 14 (Unlabored)  BP: 126/68 (Sitting, Right Arm, Standard)    Physical Exam General Mental Status -Alert, cooperative and good historian. General Appearance-pleasant, Not in acute distress. Orientation-Oriented X3. Build & Nutrition-Well nourished and Well developed.  Head and Neck Head-normocephalic, atraumatic . Neck Global Assessment - supple, no bruit auscultated on the right, no bruit auscultated on the left.  Eye Vision-Wears corrective lenses(does not have on today during the exam). Pupil - Bilateral-Regular and Round. Motion - Bilateral-EOMI.  Chest and Lung Exam Auscultation Breath sounds - clear at anterior chest wall and clear at posterior chest wall. Adventitious sounds - No Adventitious sounds.  Cardiovascular Auscultation Rhythm - Regular rate and rhythm. Heart Sounds - S1 WNL and S2 WNL. Murmurs & Other Heart Sounds - Auscultation of the heart reveals - No Murmurs.  Abdomen Palpation/Percussion Tenderness - Abdomen is non-tender to palpation. Rigidity (guarding) - Abdomen is soft. Auscultation Auscultation of the abdomen reveals - Bowel sounds normal.  Male Genitourinary Note: Not done, not pertinent to present illness   Musculoskeletal Note: His left hip can be flexed to 120, rotated in 30, out 40, abducted 40 without discomfort. Right hip flexion 110, rotation in 5, out 20, abduction 30 with discomfort. Knee exam is normal bilaterally. Pulse, sensation and motor intact both lower extremities. Gait pattern is antalgic on the right.  RADIOGRAPHS AP pelvis and lateral the right hip show that he has bone-on-bone arthritis in the right hip with large subchondral cyst. This has worsened compared to x-rays  he had last fall.   Assessment & Plan  Primary osteoarthritis of right hip (M16.11)  Note:Surgical Plans: Right Total Hip Replacement - Anterior Approach  Disposition: Home, HHPT  PCP: Dr. Lunette Stands  IV TXA  Anesthesia Issues: None  Patient was instructed on what medications to stop prior to surgery.  Signed electronically by Joelene Millin, III PA-C

## 2016-12-31 NOTE — Anesthesia Postprocedure Evaluation (Signed)
Anesthesia Post Note  Patient: Foye Spurling  Procedure(s) Performed: Procedure(s) (LRB): RIGHT TOTAL HIP ARTHROPLASTY ANTERIOR APPROACH (Right)     Patient location during evaluation: PACU Anesthesia Type: General Level of consciousness: awake and alert Pain management: pain level controlled Vital Signs Assessment: post-procedure vital signs reviewed and stable Respiratory status: spontaneous breathing, nonlabored ventilation, respiratory function stable and patient connected to nasal cannula oxygen Cardiovascular status: blood pressure returned to baseline and stable Postop Assessment: no signs of nausea or vomiting Anesthetic complications: no Comments: Spinal not effective, switch to GA    Last Vitals:  Vitals:   12/31/16 1115 12/31/16 1130  BP: (!) 107/58 (!) 115/57  Pulse: 72 66  Resp: 15 14  Temp:  36.4 C  SpO2: 100% 100%    Last Pain:  Vitals:   12/31/16 1130  TempSrc:   PainSc: Asleep    LLE Motor Response: Purposeful movement (12/31/16 1130) LLE Sensation: Numbness (12/31/16 1130) RLE Motor Response: Purposeful movement (12/31/16 1130) RLE Sensation: Numbness (12/31/16 1130) L Sensory Level: S1-Sole of foot, small toes (12/31/16 1130) R Sensory Level: S1-Sole of foot, small toes (12/31/16 1130)  Daaiel Starlin S

## 2016-12-31 NOTE — Evaluation (Signed)
Physical Therapy Evaluation Patient Details Name: Jay Porter MRN: [id number] DOB: 09/26/1945 Today's Date: 12/31/2016   History of Present Illness  71 yo male s/p R THA 12/31/16. Hx of DM.   Clinical Impression  On eval POD 0, pt was Min guard assist for mobility. He walked ~125 feet with a RW. Pain rated 7/10 with activity. He experienced some nausea during/after ambulation-RN aware. Will follow and progress activity as tolerated.     Follow Up Recommendations DC plan and follow up therapy as arranged by surgeon [facility])    Equipment Recommendations  Rolling walker with 5" wheels    Recommendations for Other Services       Precautions / Restrictions Precautions Precautions: Fall Restrictions Weight Bearing Restrictions: No RLE Weight Bearing: Weight bearing as tolerated      Mobility  Bed Mobility Overal bed mobility: Needs Assistance Bed Mobility: Supine to Sit;Sit to Supine     Supine to sit: Min guard;HOB elevated Sit to supine: Min guard;HOB elevated   General bed mobility comments: close guard for safety  Transfers Overall transfer level: Needs assistance Equipment used: Rolling walker (2 wheeled) Transfers: Sit to/from Stand Sit to Stand: Min guard         General transfer comment: close guard for safety. VCs safety, hand placement  Ambulation/Gait Ambulation/Gait assistance: Min guard Ambulation Distance (Feet): 125 Feet Assistive device: Rolling walker (2 wheeled) Gait Pattern/deviations: Step-to pattern;Step-through pattern     General Gait Details: slow gait speed. Pt c/o nausea.   Stairs            Wheelchair Mobility    Modified Rankin (Stroke Patients Only)       Balance                                             Pertinent Vitals/Pain Pain Assessment: 0-10 Pain Score: 7  Pain Location: R LE Pain Descriptors / Indicators: Aching;Sore Pain Intervention(s): Limited activity within patient's tolerance     Home Living Family/patient expects to be discharged to:: Private residence Living Arrangements: Spouse/significant other   Type of Home: House Home Access: Stairs to enter Entrance Stairs-Rails: Left Entrance Stairs-Number of Steps: 10-15 Home Layout: Multi-level Home Equipment: None      Prior Function Level of Independence: Independent               Hand Dominance        Extremity/Trunk Assessment   Upper Extremity Assessment Upper Extremity Assessment: Defer to OT evaluation    Lower Extremity Assessment Lower Extremity Assessment: Generalized weakness (s/p R THA)    Cervical / Trunk Assessment Cervical / Trunk Assessment: Normal  Communication   Communication: No difficulties  Cognition Arousal/Alertness: Awake/alert Behavior During Therapy: WFL for tasks assessed/performed Overall Cognitive Status: Within Functional Limits for tasks assessed                                        General Comments      Exercises     Assessment/Plan    PT Assessment Patient needs continued PT services  PT Problem List Decreased strength;Decreased mobility;Decreased range of motion;Decreased activity tolerance;Decreased balance;Decreased knowledge of use of DME;Pain       PT Treatment Interventions DME instruction;Therapeutic activities;Gait training;Therapeutic exercise;Functional mobility training;Stair training;Balance training;Patient/family  education    PT Goals (Current goals can be found in the Care Plan section)  Acute Rehab PT Goals Patient Stated Goal: regain PLOF PT Goal Formulation: With patient Time For Goal Achievement: 01/14/17 Potential to Achieve Goals: Good    Frequency 7X/week   Barriers to discharge        Co-evaluation               AM-PAC PT "6 Clicks" Daily Activity  Outcome Measure Difficulty turning over in bed (including adjusting bedclothes, sheets and blankets)?: A Little Difficulty moving from lying  on back to sitting on the side of the bed? : A Little Difficulty sitting down on and standing up from a chair with arms (e.g., wheelchair, bedside commode, etc,.)?: A Little Help needed moving to and from a bed to chair (including a wheelchair)?: A Little Help needed walking in hospital room?: A Little Help needed climbing 3-5 steps with a railing? : A Little 6 Click Score: 18    End of Session Equipment Utilized During Treatment: Gait belt Activity Tolerance: Patient tolerated treatment well (c/o nausea-RN made aware) Patient left: in bed;with call bell/phone within reach;with nursing/sitter in room   PT Visit Diagnosis: Muscle weakness (generalized) (M62.81);Difficulty in walking, not elsewhere classified (R26.2)    Time: 1117-3567 PT Time Calculation (min) (ACUTE ONLY): 14 min   Charges:   PT Evaluation $PT Eval Low Complexity: 1 Low     PT G Codes:          Weston Anna, MPT Pager: [phone]

## 2016-12-31 NOTE — Transfer of Care (Signed)
Immediate Anesthesia Transfer of Care Note  Patient: Foye Spurling  Procedure(s) Performed: Procedure(s): RIGHT TOTAL HIP ARTHROPLASTY ANTERIOR APPROACH (Right)  Patient Location: PACU  Anesthesia Type:General  Level of Consciousness: awake, alert , oriented and patient cooperative  Airway & Oxygen Therapy: Patient Spontanous Breathing and Patient connected to face mask oxygen  Post-op Assessment: Report given to RN, Post -op Vital signs reviewed and stable and Patient moving all extremities  Post vital signs: Reviewed and stable  Last Vitals:  Vitals:   12/31/16 0614  BP: 139/70  Pulse: 70  Resp: 18  Temp: 36.6 C  SpO2: 100%    Last Pain:  Vitals:   12/31/16 0614  TempSrc: Oral      Patients Stated Pain Goal: 4 (59/45/85 [id number])  Complications: No apparent anesthesia complications

## 2016-12-31 NOTE — Anesthesia Procedure Notes (Signed)
Procedure Name: LMA Insertion Date/Time: 12/31/2016 9:00 AM Performed by: Carleene Cooper A Pre-anesthesia Checklist: Patient identified, Emergency Drugs available, Suction available, Patient being monitored and Timeout performed Patient Re-evaluated:Patient Re-evaluated prior to induction Oxygen Delivery Method: Circle system utilized Preoxygenation: Pre-oxygenation with 100% oxygen Induction Type: IV induction LMA: LMA with gastric port inserted LMA Size: 4.0 Number of attempts: 1 Placement Confirmation: positive ETCO2 and breath sounds checked- equal and bilateral Tube secured with: Tape Dental Injury: Teeth and Oropharynx as per pre-operative assessment

## 2016-12-31 NOTE — Op Note (Signed)
OPERATIVE REPORT- TOTAL HIP ARTHROPLASTY   PREOPERATIVE DIAGNOSIS: Osteoarthritis of the Right hip.   POSTOPERATIVE DIAGNOSIS: Osteoarthritis of the Right  hip.   PROCEDURE: Right total hip arthroplasty, anterior approach.   SURGEON: Gaynelle Arabian, MD   ASSISTANT: Arlee Muslim, PA-C  ANESTHESIA:  General  ESTIMATED BLOOD LOSS:-350 ml    DRAINS: Hemovac x1.   COMPLICATIONS: None   CONDITION: PACU - hemodynamically stable.   BRIEF CLINICAL NOTE: Jay Porter is a 71 y.o. male who has advanced end-  stage arthritis of their Right  hip with progressively worsening pain and  dysfunction.The patient has failed nonoperative management and presents for  total hip arthroplasty.   PROCEDURE IN DETAIL: After successful administration of spinal  anesthetic, the traction boots for the [facility] bed were placed on both  feet and the patient was placed onto the [facility] bed, boots placed into the leg  holders. The Right hip was then isolated from the perineum with plastic  drapes and prepped and draped in the usual sterile fashion. ASIS and  greater trochanter were marked and a oblique incision was made, starting  at about 1 cm lateral and 2 cm distal to the ASIS and coursing towards  the anterior cortex of the femur. The skin was cut with a 10 blade  through subcutaneous tissue to the level of the fascia overlying the  tensor fascia lata muscle. The fascia was then incised in line with the  incision at the junction of the anterior third and posterior 2/3rd. The  muscle was teased off the fascia and then the interval between the TFL  and the rectus was developed. The Hohmann retractor was then placed at  the top of the femoral neck over the capsule. The vessels overlying the  capsule were cauterized and the fat on top of the capsule was removed.  A Hohmann retractor was then placed anterior underneath the rectus  femoris to give exposure to the entire anterior capsule. A T-shaped   capsulotomy was performed. The edges were tagged and the femoral head  was identified.       Osteophytes are removed off the superior acetabulum.  The femoral neck was then cut in situ with an oscillating saw. Traction  was then applied to the left lower extremity utilizing the [facility]  traction. The femoral head was then removed. Retractors were placed  around the acetabulum and then circumferential removal of the labrum was  performed. Osteophytes were also removed. Reaming starts at 45 mm to  medialize and  Increased in 2 mm increments to 51 mm. We reamed in  approximately 40 degrees of abduction, 20 degrees anteversion. A 52 mm  pinnacle acetabular shell was then impacted in anatomic position under  fluoroscopic guidance with excellent purchase. We did not need to place  any additional dome screws. A 32 mm neutral + 4 marathon liner was then  placed into the acetabular shell.       The femoral lift was then placed along the lateral aspect of the femur  just distal to the vastus ridge. The leg was  externally rotated and capsule  was stripped off the inferior aspect of the femoral neck down to the  level of the lesser trochanter, this was done with electrocautery. The femur was lifted after this was performed. The  leg was then placed in an extended and adducted position essentially delivering the femur. We also removed the capsule superiorly and the piriformis from the piriformis  fossa to gain excellent exposure of the  proximal femur. Rongeur was used to remove some cancellous bone to get  into the lateral portion of the proximal femur for placement of the  initial starter reamer. The starter broaches was placed  the starter broach  and was shown to go down the center of the canal. Broaching  with the  Corail system was then performed starting at size 8, coursing  Up to size 11. A size 11 had excellent torsional and rotational  and axial stability. The trial high offset neck was then  placed  with a 32 + 1 trial head. The hip was then reduced. We confirmed that  the stem was in the canal both on AP and lateral x-rays. It also has excellent sizing. The hip was reduced with outstanding stability through full extension and full external rotation.. AP pelvis was taken and the leg lengths were measured and found to be equal. Hip was then dislocated again and the femoral head and neck removed. The  femoral broach was removed. Size 11 Corail stem with a high offset  neck was then impacted into the femur following native anteversion. Has  excellent purchase in the canal. Excellent torsional and rotational and  axial stability. It is confirmed to be in the canal on AP and lateral  fluoroscopic views. The 32 + 1 ceramic head was placed and the hip  reduced with outstanding stability. Again AP pelvis was taken and it  confirmed that the leg lengths were equal. The wound was then copiously  irrigated with saline solution and the capsule reattached and repaired  with Ethibond suture. 30 ml of .25% Bupivicaine was  injected into the capsule and into the edge of the tensor fascia lata as well as subcutaneous tissue. The fascia overlying the tensor fascia lata was then closed with a running #1 V-Loc. Subcu was closed with interrupted 2-0 Vicryl and subcuticular running 4-0 Monocryl. Incision was cleaned  and dried. Steri-Strips and a bulky sterile dressing applied. Hemovac  drain was hooked to suction and then the patient was awakened and transported to  recovery in stable condition.        Please note that a surgical assistant was a medical necessity for this procedure to perform it in a safe and expeditious manner. Assistant was necessary to provide appropriate retraction of vital neurovascular structures and to prevent femoral fracture and allow for anatomic placement of the prosthesis.  Gaynelle Arabian, M.D.

## 2016-12-31 NOTE — Interval H&P Note (Signed)
History and Physical Interval Note:  12/31/2016 8:15 AM  Jay Porter  has presented today for surgery, with the diagnosis of Osteoarthritis Right hip  The various methods of treatment have been discussed with the patient and family. After consideration of risks, benefits and other options for treatment, the patient has consented to  Procedure(s): RIGHT TOTAL HIP ARTHROPLASTY ANTERIOR APPROACH (Right) as a surgical intervention .  The patient's history has been reviewed, patient examined, no change in status, stable for surgery.  I have reviewed the patient's chart and labs.  Questions were answered to the patient's satisfaction.     Gearlean Alf

## 2016-12-31 NOTE — Anesthesia Procedure Notes (Signed)
Spinal  End time: 12/31/2016 8:33 AM Staffing Resident/CRNA: Carleene Cooper A Preanesthetic Checklist Completed: patient identified, site marked, surgical consent, pre-op evaluation, timeout performed, IV checked, risks and benefits discussed and monitors and equipment checked Spinal Block Patient position: sitting Prep: ChloraPrep Patient monitoring: heart rate, continuous pulse ox and blood pressure Approach: midline Location: L3-4 Injection technique: single-shot Needle Needle type: Pencan  Needle gauge: 24 G Needle length: 10 cm Additional Notes Pt placed in sitting position. Spinal kit expiration date checked and verified. One attempt by CRNA. - heme. + CSF. Pt tolerated well.

## 2017-01-01 LAB — CBC
HCT: 32.8 % — ABNORMAL LOW (ref 39.0–52.0)
HEMOGLOBIN: 11 g/dL — AB (ref 13.0–17.0)
MCH: 31.9 pg (ref 26.0–34.0)
MCHC: 33.5 g/dL (ref 30.0–36.0)
MCV: 95.1 fL (ref 78.0–100.0)
Platelets: 162 10*3/uL (ref 150–400)
RBC: 3.45 MIL/uL — ABNORMAL LOW (ref 4.22–5.81)
RDW: 12.8 % (ref 11.5–15.5)
WBC: 14 10*3/uL — ABNORMAL HIGH (ref 4.0–10.5)

## 2017-01-01 LAB — BASIC METABOLIC PANEL
Anion gap: 6 (ref 5–15)
BUN: 18 mg/dL (ref 6–20)
CHLORIDE: 109 mmol/L (ref 101–111)
CO2: 27 mmol/L (ref 22–32)
Calcium: 8.9 mg/dL (ref 8.9–10.3)
Creatinine, Ser: 0.86 mg/dL (ref 0.61–1.24)
GFR calc Af Amer: >60 mL/min (ref >60)
GFR calc non Af Amer: >60 mL/min (ref >60)
Glucose, Bld: 156 mg/dL — ABNORMAL HIGH (ref 65–99)
Potassium: 4.3 mmol/L (ref 3.5–5.1)
SODIUM: 142 mmol/L (ref 135–145)

## 2017-01-01 LAB — GLUCOSE, CAPILLARY
GLUCOSE-CAPILLARY: 176 mg/dL — AB (ref 65–99)
Glucose-Capillary: 136 mg/dL — ABNORMAL HIGH (ref 65–99)

## 2017-01-01 MED ORDER — OXYCODONE HCL 5 MG PO TABS: 5.0000 mg | ORAL_TABLET | ORAL | 0 refills | Status: DC | PRN

## 2017-01-01 MED ORDER — RIVAROXABAN 10 MG PO TABS: 10.0000 mg | ORAL_TABLET | Freq: Every day | ORAL | 0 refills | Status: DC

## 2017-01-01 MED ORDER — METHOCARBAMOL 500 MG PO TABS: 500.0000 mg | ORAL_TABLET | Freq: Four times a day (QID) | ORAL | 0 refills | Status: DC | PRN

## 2017-01-01 MED FILL — oxyCODONE HCL 5 MG TABS: 5 | 5 days supply | Qty: 60 | Fill #0

## 2017-01-01 MED FILL — METHOCARBAMOL 500 MG TABLET: 500 | 20 days supply | Qty: 80 | Fill #0

## 2017-01-01 MED FILL — XARELTO 10 MG TABLET: 10 | 20 days supply | Qty: 20 | Fill #0

## 2017-01-01 NOTE — Discharge Instructions (Signed)
° °Dr. Frank [name] °Total Joint Specialist °[facility] °[address] °[place], [place] [address] °[phone] ° °ANTERIOR APPROACH TOTAL HIP REPLACEMENT POSTOPERATIVE DIRECTIONS ° ° °Hip Rehabilitation, Guidelines Following Surgery  °The results of a hip operation are greatly improved after range of motion and muscle strengthening exercises. Follow all safety measures which are given to protect your hip. If any of these exercises cause increased pain or swelling in your joint, decrease the amount until you are comfortable again. Then slowly increase the exercises. Call your caregiver if you have problems or questions.  ° °HOME CARE INSTRUCTIONS  °Remove items at home which could result in a fall. This includes throw rugs or furniture in walking pathways.  °· ICE to the affected hip every three hours for 30 minutes at a time and then as needed for pain and swelling.  Continue to use ice on the hip for pain and swelling from surgery. You may notice swelling that will progress down to the foot and ankle.  This is normal after surgery.  Elevate the leg when you are not up walking on it.   °· Continue to use the breathing machine which will help keep your temperature down.  It is common for your temperature to cycle up and down following surgery, especially at night when you are not up moving around and exerting yourself.  The breathing machine keeps your lungs expanded and your temperature down. ° ° °DIET °You may resume your previous home diet once your are discharged from the hospital. ° °DRESSING / WOUND CARE / SHOWERING °You may shower 3 days after surgery, but keep the wounds dry during showering.  You may use an occlusive plastic wrap (Press'n Seal for example), NO SOAKING/SUBMERGING IN THE BATHTUB.  If the bandage gets wet, change with a clean dry gauze.  If the incision gets wet, pat the wound dry with a clean towel. °You may start showering once you are discharged home but do not  submerge the incision under water. Just pat the incision dry and apply a dry gauze dressing on daily. °Change the surgical dressing daily and reapply a dry dressing each time. ° °ACTIVITY °Walk with your walker as instructed. °Use walker as long as suggested by your caregivers. °Avoid periods of inactivity such as sitting longer than an hour when not asleep. This helps prevent blood clots.  °You may resume a sexual relationship in one month or when given the OK by your doctor.  °You may return to work once you are cleared by your doctor.  °Do not drive a car for 6 weeks or until released by you surgeon.  °Do not drive while taking narcotics. ° °WEIGHT BEARING °Weight bearing as tolerated with assist device (walker, cane, etc) as directed, use it as long as suggested by your surgeon or therapist, typically at least 4-6 weeks. ° °POSTOPERATIVE CONSTIPATION PROTOCOL °Constipation - defined medically as fewer than three stools per week and severe constipation as less than one stool per week. ° °One of the most common issues patients have following surgery is constipation.  Even if you have a regular bowel pattern at home, your normal regimen is likely to be disrupted due to multiple reasons following surgery.  Combination of anesthesia, postoperative narcotics, change in appetite and fluid intake all can affect your bowels.  In order to avoid complications following surgery, here are some recommendations in order to help you during your recovery period. ° °Colace (docusate) - Pick up an over-the-counter   form of Colace or another stool softener and take twice a day as long as you are requiring postoperative pain medications.  Take with a full glass of water daily.  If you experience loose stools or diarrhea, hold the colace until you stool forms back up.  If your symptoms do not get better within 1 week or if they get worse, check with your doctor. ° °Dulcolax (bisacodyl) - Pick up over-the-counter and take as directed  by the product packaging as needed to assist with the movement of your bowels.  Take with a full glass of water.  Use this product as needed if not relieved by Colace only.  ° °MiraLax (polyethylene glycol) - Pick up over-the-counter to have on hand.  MiraLax is a solution that will increase the amount of water in your bowels to assist with bowel movements.  Take as directed and can mix with a glass of water, juice, soda, coffee, or tea.  Take if you go more than two days without a movement. °Do not use MiraLax more than once per day. Call your doctor if you are still constipated or irregular after using this medication for 7 days in a row. ° °If you continue to have problems with postoperative constipation, please contact the office for further assistance and recommendations.  If you experience "the worst abdominal pain ever" or develop nausea or vomiting, please contact the office immediatly for further recommendations for treatment. ° °ITCHING ° If you experience itching with your medications, try taking only a single pain pill, or even half a pain pill at a time.  You can also use Benadryl over the counter for itching or also to help with sleep.  ° °TED HOSE STOCKINGS °Wear the elastic stockings on both legs for three weeks following surgery during the day but you may remove then at night for sleeping. ° °MEDICATIONS °See your medication summary on the “After Visit Summary” that the nursing staff will review with you prior to discharge.  You may have some home medications which will be placed on hold until you complete the course of blood thinner medication.  It is important for you to complete the blood thinner medication as prescribed by your surgeon.  Continue your approved medications as instructed at time of discharge. ° °PRECAUTIONS °If you experience chest pain or shortness of breath - call 911 immediately for transfer to the hospital emergency department.  °If you develop a fever greater that 101 F,  purulent drainage from wound, increased redness or drainage from wound, foul odor from the wound/dressing, or calf pain - CONTACT YOUR SURGEON.   °                                                °FOLLOW-UP APPOINTMENTS °Make sure you keep all of your appointments after your operation with your surgeon and caregivers. You should call the office at the above phone number and make an appointment for approximately two weeks after the date of your surgery or on the date instructed by your surgeon outlined in the "After Visit Summary". ° °RANGE OF MOTION AND STRENGTHENING EXERCISES  °These exercises are designed to help you keep full movement of your hip joint. Follow your caregiver's or physical therapist's instructions. Perform all exercises about fifteen times, three times per day or as directed. Exercise both hips, even if you   have had only one joint replacement. These exercises can be done on a training (exercise) mat, on the floor, on a table or on a bed. Use whatever works the best and is most comfortable for you. Use music or television while you are exercising so that the exercises are a pleasant break in your day. This will make your life better with the exercises acting as a break in routine you can look forward to.  Lying on your back, slowly slide your foot toward your buttocks, raising your knee up off the floor. Then slowly slide your foot back down until your leg is straight again.  Lying on your back spread your legs as far apart as you can without causing discomfort.  Lying on your side, raise your upper leg and foot straight up from the floor as far as is comfortable. Slowly lower the leg and repeat.  Lying on your back, tighten up the muscle in the front of your thigh (quadriceps muscles). You can do this by keeping your leg straight and trying to raise your heel off the floor. This helps strengthen the largest muscle supporting your knee.  Lying on your back, tighten up the muscles of your  buttocks both with the legs straight and with the knee bent at a comfortable angle while keeping your heel on the floor.   IF YOU ARE TRANSFERRED TO A SKILLED REHAB FACILITY If the patient is transferred to a skilled rehab facility following release from the hospital, a list of the current medications will be sent to the facility for the patient to continue.  When discharged from the skilled rehab facility, please have the facility set up the patient's [place] prior to being released. Also, the skilled facility will be responsible for providing the patient with their medications at time of release from the facility to include their pain medication, the muscle relaxants, and their blood thinner medication. If the patient is still at the rehab facility at time of the two week follow up appointment, the skilled rehab facility will also need to assist the patient in arranging follow up appointment in our office and any transportation needs.  MAKE SURE YOU:  Understand these instructions.  Get help right away if you are not doing well or get worse.    Pick up stool softner and laxative for home use following surgery while on pain medications. Do not submerge incision under water. Please use good hand washing techniques while changing dressing each day. May shower starting three days after surgery. Please use a clean towel to pat the incision dry following showers. Continue to use ice for pain and swelling after surgery. Do not use any lotions or creams on the incision until instructed by your surgeon.  Take Xarelto for two and a half more weeks following discharge from the hospital, then discontinue Xarelto. Once the patient has completed the blood thinner regimen, then take a Baby 81 mg Aspirin daily for three more weeks.

## 2017-01-01 NOTE — Progress Notes (Signed)
Discharge planning, spoke with patient at bedside. Have chosen [facility] for [facility]. Contacted [facility] for referral. Needs RW and 3n1, contacted [facility] to deliver to room. [phone]

## 2017-01-01 NOTE — Discharge Summary (Signed)
Physician Discharge Summary   Patient ID: Jay Porter MRN: [id number] DOB/AGE: Aug 01, 1945 71 y.o.  Admit date: 12/31/2016 Discharge date: 01/01/2017  Primary Diagnosis:  Osteoarthritis of the Right hip.   Admission Diagnoses:  Past Medical History:  Diagnosis Date  . Arthritis   . Diabetes mellitus without complication ([place])   . Hypertension    Discharge Diagnoses:   Principal Problem:   OA (osteoarthritis) of hip  Estimated body mass index is 27.28 kg/m as calculated from the following:   Height as of this encounter: '5\' 6"'  (1.676 m).   Weight as of this encounter: 76.7 kg (169 lb).  Procedure(s) (LRB): RIGHT TOTAL HIP ARTHROPLASTY ANTERIOR APPROACH (Right)   Consults: None  HPI: Jay Porter is a 71 y.o. male who has advanced end-  stage arthritis of their Right  hip with progressively worsening pain and  dysfunction.The patient has failed nonoperative management and presents for  total hip arthroplasty.   Laboratory Data: Admission on 12/31/2016  Component Date Value Ref Range Status  . Sodium 12/31/2016 138  135 - 145 mmol/L Final  . Potassium 12/31/2016 3.8  3.5 - 5.1 mmol/L Final  . Chloride 12/31/2016 106  101 - 111 mmol/L Final  . CO2 12/31/2016 26  22 - 32 mmol/L Final  . Glucose, Bld 12/31/2016 185* 65 - 99 mg/dL Final  . BUN 12/31/2016 19  6 - 20 mg/dL Final  . Creatinine, Ser 12/31/2016 0.76  0.61 - 1.24 mg/dL Final  . Calcium 12/31/2016 8.3* 8.9 - 10.3 mg/dL Final  . Total Protein 12/31/2016 6.0* 6.5 - 8.1 g/dL Final  . Albumin 12/31/2016 3.3* 3.5 - 5.0 g/dL Final  . AST 12/31/2016 26  15 - 41 U/L Final  . ALT 12/31/2016 25  17 - 63 U/L Final  . Alkaline Phosphatase 12/31/2016 62  38 - 126 U/L Final  . Total Bilirubin 12/31/2016 0.7  0.3 - 1.2 mg/dL Final  . GFR calc non Af Amer 12/31/2016 >60  >60 mL/min Final  . GFR calc Af Amer 12/31/2016 >60  >60 mL/min Final   Comment: (NOTE) The eGFR has been calculated using the CKD EPI equation. This  calculation has not been validated in all clinical situations. eGFR's persistently <60 mL/min signify possible Chronic Kidney Disease.   . Anion gap 12/31/2016 6  5 - 15 Final  . Glucose-Capillary 12/31/2016 93  65 - 99 mg/dL Final  . Comment 1 12/31/2016 Notify RN   Final  . Glucose-Capillary 12/31/2016 115* 65 - 99 mg/dL Final  . Glucose-Capillary 12/31/2016 176* 65 - 99 mg/dL Final  . WBC 01/01/2017 14.0* 4.0 - 10.5 K/uL Final  . RBC 01/01/2017 3.45* 4.22 - 5.81 MIL/uL Final  . Hemoglobin 01/01/2017 11.0* 13.0 - 17.0 g/dL Final  . HCT 01/01/2017 32.8* 39.0 - 52.0 % Final  . MCV 01/01/2017 95.1  78.0 - 100.0 fL Final  . MCH 01/01/2017 31.9  26.0 - 34.0 pg Final  . MCHC 01/01/2017 33.5  30.0 - 36.0 g/dL Final  . RDW 01/01/2017 12.8  11.5 - 15.5 % Final  . Platelets 01/01/2017 162  150 - 400 K/uL Final  . Sodium 01/01/2017 142  135 - 145 mmol/L Final  . Potassium 01/01/2017 4.3  3.5 - 5.1 mmol/L Final  . Chloride 01/01/2017 109  101 - 111 mmol/L Final  . CO2 01/01/2017 27  22 - 32 mmol/L Final  . Glucose, Bld 01/01/2017 156* 65 - 99 mg/dL Final  . BUN 01/01/2017 18  6 - 20 mg/dL Final  . Creatinine, Ser 01/01/2017 0.86  0.61 - 1.24 mg/dL Final  . Calcium 01/01/2017 8.9  8.9 - 10.3 mg/dL Final  . GFR calc non Af Amer 01/01/2017 >60  >60 mL/min Final  . GFR calc Af Amer 01/01/2017 >60  >60 mL/min Final   Comment: (NOTE) The eGFR has been calculated using the CKD EPI equation. This calculation has not been validated in all clinical situations. eGFR's persistently <60 mL/min signify possible Chronic Kidney Disease.   . Anion gap 01/01/2017 6  5 - 15 Final  . Glucose-Capillary 12/31/2016 226* 65 - 99 mg/dL Final  . Glucose-Capillary 12/31/2016 184* 65 - 99 mg/dL Final  . Comment 1 12/31/2016 Notify RN   Final  . Comment 2 12/31/2016 Document in Chart   Final  . Glucose-Capillary 01/01/2017 136* 65 - 99 mg/dL Final  Hospital Outpatient Visit on 12/25/2016  Component Date Value Ref  Range Status  . aPTT 12/25/2016 30  24 - 36 seconds Final  . WBC 12/25/2016 4.8  4.0 - 10.5 K/uL Final  . RBC 12/25/2016 4.51  4.22 - 5.81 MIL/uL Final  . Hemoglobin 12/25/2016 14.6  13.0 - 17.0 g/dL Final  . HCT 12/25/2016 43.1  39.0 - 52.0 % Final  . MCV 12/25/2016 95.6  78.0 - 100.0 fL Final  . MCH 12/25/2016 32.4  26.0 - 34.0 pg Final  . MCHC 12/25/2016 33.9  30.0 - 36.0 g/dL Final  . RDW 12/25/2016 12.9  11.5 - 15.5 % Final  . Platelets 12/25/2016 PLATELET CLUMPS NOTED ON SMEAR, COUNT APPEARS DECREASED  150 - 400 K/uL Final  . Prothrombin Time 12/25/2016 13.1  11.4 - 15.2 seconds Final  . INR 12/25/2016 1.00   Final  . ABO/RH(D) 12/25/2016 O POS   Final  . Antibody Screen 12/25/2016 NEG   Final  . Sample Expiration 12/25/2016 01/03/2017   Final  . Extend sample reason 12/25/2016 NO TRANSFUSIONS OR PREGNANCY IN THE PAST 3 MONTHS   Final  . Hgb A1c MFr Bld 12/25/2016 5.5  4.8 - 5.6 % Final   Comment: (NOTE) Pre diabetes:          5.7%-6.4% Diabetes:              >6.4% Glycemic control for   <7.0% adults with diabetes   . Mean Plasma Glucose 12/25/2016 111.15  mg/dL Final   Performed at [facility] [address], [place], [place] [address]  . MRSA, PCR 12/25/2016 NEGATIVE  NEGATIVE Final  . Staphylococcus aureus 12/25/2016 NEGATIVE  NEGATIVE Final   Comment: (NOTE) The Xpert SA Assay (FDA approved for NASAL specimens in patients 41 years of age and older), is one component of a comprehensive surveillance program. It is not intended to diagnose infection nor to guide or monitor treatment.   . Glucose-Capillary 12/25/2016 118* 65 - 99 mg/dL Final  . ABO/RH(D) 12/25/2016 O POS   Final     X-Rays:Dg Pelvis Portable  Result Date: 12/31/2016 CLINICAL DATA:  Status post right hip replacement today. EXAM: PORTABLE PELVIS 1-2 VIEWS COMPARISON:  Plain films right hip 12/19/2015. 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 The device is located. No fracture is  identified. Gas the soft tissues and surgical drain are noted. 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 IMPRESSION: Status post right total hip replacement.  No acute abnormality. Electronically Signed   By: Inge Rise M.D.   On: 12/31/2016 11:01   Dg C-arm 1-60 Min-no  Report  Result Date: 12/31/2016 Fluoroscopy was utilized by the requesting physician.  No radiographic interpretation.    EKG: Orders placed or performed during the hospital encounter of 12/25/16  . EKG 12 lead  . EKG 12 lead     Hospital Course: Patient was admitted to [facility] and taken to the OR and underwent the above state procedure without complications.  Patient tolerated the procedure well and was later transferred to the recovery room and then to the orthopaedic floor for postoperative care.  They were given PO and IV analgesics for pain control following their surgery.  They were given 24 hours of postoperative antibiotics of  Anti-infectives    Start     Dose/Rate Route Frequency Ordered Stop   12/31/16 1430  ceFAZolin (ANCEF) IVPB 2g/100 mL premix     2 g 200 mL/hr over 30 Minutes Intravenous Every 6 hours 12/31/16 1157 12/31/16 2105   12/31/16 0608  ceFAZolin (ANCEF) 2-4 GM/100ML-% IVPB    Comments:  [place] Session   : cabinet override      12/31/16 [phone] 12/31/16 0834   12/31/16 0603  ceFAZolin (ANCEF) IVPB 2g/100 mL premix     2 g 200 mL/hr over 30 Minutes Intravenous On call to O.R. 12/31/16 0932 12/31/16 0904     and started on DVT prophylaxis in the form of Xarelto.   PT and OT were ordered for total hip protocol.  The patient was allowed to be WBAT with therapy. Discharge planning was consulted to help with postop disposition and equipment needs.  Patient had a good night on the evening of surgery.  They started to get up OOB with therapy on day one.  Hemovac drain was pulled without difficulty.  Dressing was checked and was clean and day.  By day three, the patient had progressed with therapy and  meeting their goals.  Incision was healing well.  Patient was seen in rounds and was ready to go home.  Diet - Cardiac diet and Diabetic diet Follow up - in 2 weeks Activity - WBAT Disposition - Home Condition Upon Discharge - Good D/C Meds - See DC Summary DVT Prophylaxis - Xarelto  Discharge Instructions    Call MD / Call 911    Complete by:  As directed    If you experience chest pain or shortness of breath, CALL 911 and be transported to the hospital emergency room.  If you develope a fever above 101 F, pus (white drainage) or increased drainage or redness at the wound, or calf pain, call your surgeon's office.   Change dressing    Complete by:  As directed    You may change your dressing dressing daily with sterile 4 x 4 inch gauze dressing and paper tape.  Do not submerge the incision under water.   Constipation Prevention    Complete by:  As directed    Drink plenty of fluids.  Prune juice may be helpful.  You may use a stool softener, such as Colace (over the counter) 100 mg twice a day.  Use MiraLax (over the counter) for constipation as needed.   Diet - low sodium heart healthy    Complete by:  As directed    Diet Carb Modified    Complete by:  As directed    Discharge instructions    Complete by:  As directed    Take Xarelto for two and a half more weeks, then discontinue Xarelto. Once the patient has completed the blood  thinner regimen, then take a Baby 81 mg Aspirin daily for three more weeks.   Pick up stool softner and laxative for home use following surgery while on pain medications. Do not submerge incision under water. Please use good hand washing techniques while changing dressing each day. May shower starting three days after surgery. Please use a clean towel to pat the incision dry following showers. Continue to use ice for pain and swelling after surgery. Do not use any lotions or creams on the incision until instructed by your surgeon.  Wear both TED hose  on both legs during the day every day for three weeks, but may remove the TED hose at night at home.  Postoperative Constipation Protocol  Constipation - defined medically as fewer than three stools per week and severe constipation as less than one stool per week.  One of the most common issues patients have following surgery is constipation.  Even if you have a regular bowel pattern at home, your normal regimen is likely to be disrupted due to multiple reasons following surgery.  Combination of anesthesia, postoperative narcotics, change in appetite and fluid intake all can affect your bowels.  In order to avoid complications following surgery, here are some recommendations in order to help you during your recovery period.  Colace (docusate) - Pick up an over-the-counter form of Colace or another stool softener and take twice a day as long as you are requiring postoperative pain medications.  Take with a full glass of water daily.  If you experience loose stools or diarrhea, hold the colace until you stool forms back up.  If your symptoms do not get better within 1 week or if they get worse, check with your doctor.  Dulcolax (bisacodyl) - Pick up over-the-counter and take as directed by the product packaging as needed to assist with the movement of your bowels.  Take with a full glass of water.  Use this product as needed if not relieved by Colace only.   MiraLax (polyethylene glycol) - Pick up over-the-counter to have on hand.  MiraLax is a solution that will increase the amount of water in your bowels to assist with bowel movements.  Take as directed and can mix with a glass of water, juice, soda, coffee, or tea.  Take if you go more than two days without a movement. Do not use MiraLax more than once per day. Call your doctor if you are still constipated or irregular after using this medication for 7 days in a row.  If you continue to have problems with postoperative constipation, please contact  the office for further assistance and recommendations.  If you experience "the worst abdominal pain ever" or develop nausea or vomiting, please contact the office immediatly for further recommendations for treatment.   Do not sit on low chairs, stoools or toilet seats, as it may be difficult to get up from low surfaces    Complete by:  As directed    Driving restrictions    Complete by:  As directed    No driving until released by the physician.   Increase activity slowly as tolerated    Complete by:  As directed    Lifting restrictions    Complete by:  As directed    No lifting until released by the physician.   Patient may shower    Complete by:  As directed    You may shower without a dressing once there is no drainage.  Do not wash over  the wound.  If drainage remains, do not shower until drainage stops.   TED hose    Complete by:  As directed    Use stockings (TED hose) for 3 weeks on both leg(s).  You may remove them at night for sleeping.   Weight bearing as tolerated    Complete by:  As directed    Laterality:  right   Extremity:  Lower     Allergies as of 01/01/2017      Reactions   Ace Inhibitors Cough      Medication List    STOP taking these medications   naproxen sodium 220 MG tablet Commonly known as:  ANAPROX   Vitamin B-12 2500 MCG Subl     TAKE these medications   amLODipine 10 MG tablet Commonly known as:  NORVASC Take 10 mg by mouth daily.   irbesartan 300 MG tablet Commonly known as:  AVAPRO Take 300 mg by mouth daily.   metFORMIN 500 MG tablet Commonly known as:  GLUCOPHAGE Take 500 mg by mouth daily.   methocarbamol 500 MG tablet Commonly known as:  ROBAXIN Take 1 tablet (500 mg total) by mouth every 6 (six) hours as needed for muscle spasms.   oxyCODONE 5 MG immediate release tablet Commonly known as:  Oxy IR/ROXICODONE Take 1-2 tablets (5-10 mg total) by mouth every 4 (four) hours as needed for moderate pain or severe pain.   rivaroxaban  10 MG Tabs tablet Commonly known as:  XARELTO Take 1 tablet (10 mg total) by mouth daily with breakfast. Take Xarelto for two and a half more weeks following discharge from the hospital, then discontinue Xarelto. Once the patient has completed the blood thinner regimen, then take a Baby 81 mg Aspirin daily for three more weeks.   rosuvastatin 5 MG tablet Commonly known as:  CRESTOR Take 5 mg by mouth at bedtime.            Discharge Care Instructions        Start     Ordered   01/01/17 0000  methocarbamol (ROBAXIN) 500 MG tablet  Every 6 hours PRN    Question:  Supervising Provider  Answer:  Gaynelle Arabian   01/01/17 0747   01/01/17 0000  oxyCODONE (OXY IR/ROXICODONE) 5 MG immediate release tablet  Every 4 hours PRN    Question:  Supervising Provider  Answer:  Gaynelle Arabian   01/01/17 0747   01/01/17 0000  rivaroxaban (XARELTO) 10 MG TABS tablet  Daily with breakfast    Question:  Supervising Provider  Answer:  Gaynelle Arabian   01/01/17 0747   01/01/17 0000  Call MD / Call 911    Comments:  If you experience chest pain or shortness of breath, CALL 911 and be transported to the hospital emergency room.  If you develope a fever above 101 F, pus (white drainage) or increased drainage or redness at the wound, or calf pain, call your surgeon's office.   01/01/17 0747   01/01/17 0000  Discharge instructions    Comments:  Take Xarelto for two and a half more weeks, then discontinue Xarelto. Once the patient has completed the blood thinner regimen, then take a Baby 81 mg Aspirin daily for three more weeks.   Pick up stool softner and laxative for home use following surgery while on pain medications. Do not submerge incision under water. Please use good hand washing techniques while changing dressing each day. May shower starting three days after surgery. Please use a  clean towel to pat the incision dry following showers. Continue to use ice for pain and swelling after surgery. Do  not use any lotions or creams on the incision until instructed by your surgeon.  Wear both TED hose on both legs during the day every day for three weeks, but may remove the TED hose at night at home.  Postoperative Constipation Protocol  Constipation - defined medically as fewer than three stools per week and severe constipation as less than one stool per week.  One of the most common issues patients have following surgery is constipation.  Even if you have a regular bowel pattern at home, your normal regimen is likely to be disrupted due to multiple reasons following surgery.  Combination of anesthesia, postoperative narcotics, change in appetite and fluid intake all can affect your bowels.  In order to avoid complications following surgery, here are some recommendations in order to help you during your recovery period.  Colace (docusate) - Pick up an over-the-counter form of Colace or another stool softener and take twice a day as long as you are requiring postoperative pain medications.  Take with a full glass of water daily.  If you experience loose stools or diarrhea, hold the colace until you stool forms back up.  If your symptoms do not get better within 1 week or if they get worse, check with your doctor.  Dulcolax (bisacodyl) - Pick up over-the-counter and take as directed by the product packaging as needed to assist with the movement of your bowels.  Take with a full glass of water.  Use this product as needed if not relieved by Colace only.   MiraLax (polyethylene glycol) - Pick up over-the-counter to have on hand.  MiraLax is a solution that will increase the amount of water in your bowels to assist with bowel movements.  Take as directed and can mix with a glass of water, juice, soda, coffee, or tea.  Take if you go more than two days without a movement. Do not use MiraLax more than once per day. Call your doctor if you are still constipated or irregular after using this medication for 7  days in a row.  If you continue to have problems with postoperative constipation, please contact the office for further assistance and recommendations.  If you experience "the worst abdominal pain ever" or develop nausea or vomiting, please contact the office immediatly for further recommendations for treatment.   01/01/17 0747   01/01/17 0000  Diet - low sodium heart healthy     01/01/17 0747   01/01/17 0000  Constipation Prevention    Comments:  Drink plenty of fluids.  Prune juice may be helpful.  You may use a stool softener, such as Colace (over the counter) 100 mg twice a day.  Use MiraLax (over the counter) for constipation as needed.   01/01/17 0747   01/01/17 0000  Diet Carb Modified     01/01/17 0747   01/01/17 0000  Increase activity slowly as tolerated     01/01/17 0747   01/01/17 0000  Patient may shower    Comments:  You may shower without a dressing once there is no drainage.  Do not wash over the wound.  If drainage remains, do not shower until drainage stops.   01/01/17 0747   01/01/17 0000  Weight bearing as tolerated    Question Answer Comment  Laterality right   Extremity Lower      01/01/17 0747   01/01/17  0000  Driving restrictions    Comments:  No driving until released by the physician.   01/01/17 0747   01/01/17 0000  Lifting restrictions    Comments:  No lifting until released by the physician.   01/01/17 0747   01/01/17 0000  Change dressing    Comments:  You may change your dressing dressing daily with sterile 4 x 4 inch gauze dressing and paper tape.  Do not submerge the incision under water.   01/01/17 0747   01/01/17 0000  TED hose    Comments:  Use stockings (TED hose) for 3 weeks on both leg(s).  You may remove them at night for sleeping.   01/01/17 0747   01/01/17 0000  Do not sit on low chairs, stoools or toilet seats, as it may be difficult to get up from low surfaces     01/01/17 0747     Follow-up Information    Gaynelle Arabian, MD. Schedule  an appointment as soon as possible for a visit on 01/13/2017.   Specialty:  Orthopedic Surgery Contact information: [address] [place] [address] [phone]           Signed: Arlee Muslim, PA-C Orthopaedic Surgery 01/01/2017, 7:48 AM

## 2017-01-01 NOTE — Progress Notes (Signed)
Physical Therapy Treatment Patient Details Name: Jay Porter MRN: [id number] DOB: 01/28/1946 Today's Date: 01/01/2017    History of Present Illness 71 yo male s/p R THA 12/31/16. Hx of DM.     PT Comments    Progressing very well with mobility. Reviewed/practiced exercises, gait training, and stair negotiation training. Verbally reviewed car transfer. All education completed. Ready to d/c from PT standpoint-made RN aware.    Follow Up Recommendations  DC plan and follow up therapy as arranged by surgeon [facility])     Equipment Recommendations  Rolling walker with 5" wheels    Recommendations for Other Services       Precautions / Restrictions Precautions Precautions: Fall Restrictions Weight Bearing Restrictions: No RLE Weight Bearing: Weight bearing as tolerated    Mobility  Bed Mobility Overal bed mobility: Needs Assistance Bed Mobility: Supine to Sit          General bed mobility comments: oob in recliner  Transfers Overall transfer level: Needs assistance Equipment used: Rolling walker (2 wheeled) Transfers: Sit to/from Stand Sit to Stand: Supervision         General transfer comment: for safety. cues hand placement  Ambulation/Gait Ambulation/Gait assistance: Supervision Ambulation Distance (Feet): 150 Feet Assistive device: Rolling walker (2 wheeled) Gait Pattern/deviations: Step-through pattern     General Gait Details: good gait speed. no lob with RW use.    Stairs Stairs: Yes Min guard assist Stair Management: Step to pattern;Forwards;One rail Left Number of Stairs: 9 General stair comments: Close guard for safety. VCs safety, technique, sequence.   Wheelchair Mobility    Modified Rankin (Stroke Patients Only)       Balance                                            Cognition Arousal/Alertness: Awake/alert Behavior During Therapy: WFL for tasks assessed/performed Overall Cognitive Status: Within Functional Limits  for tasks assessed                                        Exercises Total Joint Exercises    Hip ABduction/ADduction: AROM;Right;10 reps;Standing Long Arc Quad: AROM;Right;10 reps;Seated Knee Flexion: AROM;Right;10 reps;Standing Marching in Standing: AROM;Both;10 reps;Standing General Exercises - Lower Extremity Heel Raises: AROM;Both;10 reps;Standing    General Comments        Pertinent Vitals/Pain Pain Assessment: 0-10 Pain Score: 5  Pain Location: R LE Pain Descriptors / Indicators: Aching;Sore Pain Intervention(s): Monitored during session;Repositioned;Ice applied    Home Living                      Prior Function            PT Goals (current goals can now be found in the care plan section) Progress towards PT goals: Progressing toward goals    Frequency    7X/week      PT Plan Current plan remains appropriate    Co-evaluation              AM-PAC PT "6 Clicks" Daily Activity  Outcome Measure  Difficulty turning over in bed (including adjusting bedclothes, sheets and blankets)?: None Difficulty moving from lying on back to sitting on the side of the bed? : None Difficulty sitting down on and standing up from a  chair with arms (e.g., wheelchair, bedside commode, etc,.)?: None Help needed moving to and from a bed to chair (including a wheelchair)?: A Little Help needed walking in hospital room?: A Little Help needed climbing 3-5 steps with a railing? : A Little 6 Click Score: 21    End of Session Equipment Utilized During Treatment: Gait belt Activity Tolerance: Patient tolerated treatment well Patient left: in chair;with call bell/phone within reach;with family/visitor present   PT Visit Diagnosis: Muscle weakness (generalized) (M62.81);Difficulty in walking, not elsewhere classified (R26.2)     Time: 4081-4481 PT Time Calculation (min) (ACUTE ONLY): 14 min  Charges:  $Gait Training: 8-22 mins                     G Codes:          Weston Anna, MPT Pager: [phone]

## 2017-01-01 NOTE — Progress Notes (Signed)
Pt ready for discharge home with wife. Reviewed all d/c instructions and prescription. Changed dressing and pt stated he had not further questions.

## 2017-01-01 NOTE — Progress Notes (Signed)
Physical Therapy Treatment Patient Details Name: BENJIE RICKETSON MRN: [id number] DOB: [birth year] Today's Date: 01/01/2017    History of Present Illness 71 yo male s/p R THA 12/31/16. Hx of DM.     PT Comments    Progressing very well with mobility. Pt could have discharged this am however wife requested a 2nd session. Will return to practice stair negotiation prior to d/c.    Follow Up Recommendations  DC plan and follow up therapy as arranged by surgeon [facility])     Equipment Recommendations  Rolling walker with 5" wheels    Recommendations for Other Services       Precautions / Restrictions Precautions Precautions: Fall Restrictions Weight Bearing Restrictions: No RLE Weight Bearing: Weight bearing as tolerated    Mobility  Bed Mobility Overal bed mobility: Needs Assistance Bed Mobility: Supine to Sit     Supine to sit: HOB elevated;Supervision        Transfers Overall transfer level: Needs assistance Equipment used: Rolling walker (2 wheeled) Transfers: Sit to/from Stand Sit to Stand: Supervision         General transfer comment: for safety. cues hand placement  Ambulation/Gait Ambulation/Gait assistance: Supervision Ambulation Distance (Feet): 150 Feet Assistive device: Rolling walker (2 wheeled) Gait Pattern/deviations: Step-through pattern     General Gait Details: good gait speed. no lob with RW use.    Stairs            Wheelchair Mobility    Modified Rankin (Stroke Patients Only)       Balance                                            Cognition Arousal/Alertness: Awake/alert Behavior During Therapy: WFL for tasks assessed/performed Overall Cognitive Status: Within Functional Limits for tasks assessed                                        Exercises Total Joint Exercises Ankle Circles/Pumps: AROM;Both;10 reps;Supine Quad Sets: AROM;Both;10 reps;Supine Heel Slides: AROM;Right;10  reps;Supine Hip ABduction/ADduction: AROM;Right;10 reps;Supine    General Comments        Pertinent Vitals/Pain Pain Assessment: 0-10 Pain Score: 5  Pain Location: R LE Pain Descriptors / Indicators: Aching;Sore Pain Intervention(s): Monitored during session;Repositioned;Ice applied    Home Living                      Prior Function            PT Goals (current goals can now be found in the care plan section) Progress towards PT goals: Progressing toward goals    Frequency    7X/week      PT Plan      Co-evaluation              AM-PAC PT "6 Clicks" Daily Activity  Outcome Measure  Difficulty turning over in bed (including adjusting bedclothes, sheets and blankets)?: None Difficulty moving from lying on back to sitting on the side of the bed? : None Difficulty sitting down on and standing up from a chair with arms (e.g., wheelchair, bedside commode, etc,.)?: None Help needed moving to and from a bed to chair (including a wheelchair)?: A Little Help needed walking in hospital room?: A Little Help needed climbing 3-5 steps  with a railing? : A Little 6 Click Score: 21    End of Session   Activity Tolerance: Patient tolerated treatment well Patient left: in chair;with call bell/phone within reach;with family/visitor present   PT Visit Diagnosis: Muscle weakness (generalized) (M62.81);Difficulty in walking, not elsewhere classified (R26.2)     Time: 8309-4076 PT Time Calculation (min) (ACUTE ONLY): 14 min  Charges:  $Gait Training: 8-22 mins                    G Codes:          Weston Anna, MPT Pager: [phone]

## 2017-01-01 NOTE — Consult Note (Addendum)
   [facility] CM Inpatient Consult   01/01/2017  Jay Porter 04/08/1946 [id number]    Went to bedside to speak with Dr. Carlis Abbott on behalf of [place] to Wellness program for [employer] employees/dependents with [facility] insurance.  Discussed Link to Wellness for DM management. Provided Link to [employer], 24-hr nurse line magnet, contact information, and Link to [employer].   Explained to Mr. Hoogendoorn that he will receive a post hospital discharge call. He is agreeable to this. Confirmed best contact number as [phone].  Dr. Carlis Abbott indicates he will look over Link to Wellness packet for DM management enrollment. Discussed that Probation officer will request Telephonic RNCM inquire about interest in Link to Wellness program upon post discharge call. He is agreeable to this.   Spoke with inpatient RNCM about above.     Marthenia Rolling, MSN-Ed, RN,BSN [facility] Liaison [phone]

## 2017-01-01 NOTE — Progress Notes (Signed)
   Subjective: 1 Day Post-Op Procedure(s) (LRB): RIGHT TOTAL HIP ARTHROPLASTY ANTERIOR APPROACH (Right) Patient reports pain as mild.   Patient seen in rounds by Dr. Wynelle Link. Patient is well, but has had some minor complaints of pain in the hip, requiring pain medications We will start therapy today.  If they do well with therapy and meets all goals, then will allow home later this afternoon following therapy. Plan is to go Home after hospital stay.  Objective: Vital signs in last 24 hours: Temp:  [97.5 F (36.4 C)-98.8 F (37.1 C)] 97.8 F (36.6 C) (09/06 0540) Pulse Rate:  [61-109] 65 (09/06 0540) Resp:  [14-22] 20 (09/06 0540) BP: (92-124)/(48-72) 122/57 (09/06 0540) SpO2:  [94 %-100 %] 100 % (09/06 0540)  Intake/Output from previous day:  Intake/Output Summary (Last 24 hours) at 01/01/17 0738 Last data filed at 01/01/17 0600  Gross per 24 hour  Intake             4290 ml  Output             2735 ml  Net             1555 ml    Intake/Output this shift: No intake/output data recorded.  Labs:  Recent Labs  01/01/17 0551  HGB 11.0*    Recent Labs  01/01/17 0551  WBC 14.0*  RBC 3.45*  HCT 32.8*  PLT 162    Recent Labs  12/31/16 1212 01/01/17 0551  NA 138 142  K 3.8 4.3  CL 106 109  CO2 26 27  BUN 19 18  CREATININE 0.76 0.86  GLUCOSE 185* 156*  CALCIUM 8.3* 8.9   No results for input(s): LABPT, INR in the last 72 hours.  EXAM General - Patient is Alert, Appropriate and Oriented Extremity - Neurovascular intact Sensation intact distally Intact pulses distally Dorsiflexion/Plantar flexion intact Dressing - dressing C/D/I Motor Function - intact, moving foot and toes well on exam.  Hemovac pulled without difficulty.  Past Medical History:  Diagnosis Date  . Arthritis   . Diabetes mellitus without complication ([place])   . Hypertension     Assessment/Plan: 1 Day Post-Op Procedure(s) (LRB): RIGHT TOTAL HIP ARTHROPLASTY ANTERIOR APPROACH  (Right) Principal Problem:   OA (osteoarthritis) of hip  Estimated body mass index is 27.28 kg/m as calculated from the following:   Height as of this encounter: 5\' 6"  (1.676 m).   Weight as of this encounter: 76.7 kg (169 lb). Advance diet Up with therapy Discharge home with home health  DVT Prophylaxis - Xarelto Weight Bearing As Tolerated right Leg Hemovac Pulled Begin Therapy  If meets goals and able to go home: Up with therapy Diet - Cardiac diet and Diabetic diet Follow up - in 2 weeks Activity - WBAT Disposition - Home Condition Upon Discharge - Good D/C Meds - See DC Summary DVT Prophylaxis - [place], PA-C Orthopaedic Surgery 01/01/2017, 7:38 AM

## 2017-01-03 DIAGNOSIS — Z7901 Long term (current) use of anticoagulants: Secondary | ICD-10-CM | POA: Diagnosis not present

## 2017-01-03 DIAGNOSIS — Z96641 Presence of right artificial hip joint: Secondary | ICD-10-CM | POA: Diagnosis not present

## 2017-01-03 DIAGNOSIS — Z471 Aftercare following joint replacement surgery: Secondary | ICD-10-CM | POA: Diagnosis not present

## 2017-01-03 DIAGNOSIS — Z7984 Long term (current) use of oral hypoglycemic drugs: Secondary | ICD-10-CM | POA: Diagnosis not present

## 2017-01-03 DIAGNOSIS — E119 Type 2 diabetes mellitus without complications: Secondary | ICD-10-CM | POA: Diagnosis not present

## 2017-01-03 DIAGNOSIS — Z9181 History of falling: Secondary | ICD-10-CM | POA: Diagnosis not present

## 2017-01-03 DIAGNOSIS — I10 Essential (primary) hypertension: Secondary | ICD-10-CM | POA: Diagnosis not present

## 2017-01-06 ENCOUNTER — Encounter: Payer: Self-pay | Admitting: *Deleted

## 2017-01-06 ENCOUNTER — Other Ambulatory Visit: Payer: Self-pay | Admitting: *Deleted

## 2017-01-06 DIAGNOSIS — Z7984 Long term (current) use of oral hypoglycemic drugs: Secondary | ICD-10-CM | POA: Diagnosis not present

## 2017-01-06 DIAGNOSIS — Z7901 Long term (current) use of anticoagulants: Secondary | ICD-10-CM | POA: Diagnosis not present

## 2017-01-06 DIAGNOSIS — E119 Type 2 diabetes mellitus without complications: Secondary | ICD-10-CM | POA: Diagnosis not present

## 2017-01-06 DIAGNOSIS — Z9181 History of falling: Secondary | ICD-10-CM | POA: Diagnosis not present

## 2017-01-06 DIAGNOSIS — I10 Essential (primary) hypertension: Secondary | ICD-10-CM | POA: Diagnosis not present

## 2017-01-06 DIAGNOSIS — Z96641 Presence of right artificial hip joint: Secondary | ICD-10-CM | POA: Diagnosis not present

## 2017-01-06 DIAGNOSIS — Z471 Aftercare following joint replacement surgery: Secondary | ICD-10-CM | POA: Diagnosis not present

## 2017-01-06 NOTE — Patient Outreach (Signed)
[facility]) Care Management  01/06/2017  Jay Porter 01-26-1946 [id number]   Subjective: Telephone call to patient's home  number, spoke with patient, and HIPAA verified.  Discussed [facility] Care Management UMR Transition of care follow up, patient voiced understanding, and is in agreement to follow up.   Patient states he remember talking with this RNCM in the past, he is doing well, working through the hard part of surgery, the recovery, able to self manage pain, receiving home health services, and has a follow up appointment with the surgeon on 01/13/17.  Patient voices understanding of medical diagnosis, surgery, and treatment plan.  [facility] benefits discussed on 12/24/16 preoperative call, states he is in the process of following up on the family medical leave act paperwork, is aware [place] Management is a free service, and  states no additional questions at this time.  Discussed the [place] and Link to [employer] for diabetes management, states he is interested, and is in agreement to a referral.   RNCM advised patient that [facility] [place] [facility] will be calling him to set up appointment and/ or advised which program he is eligible for diabetes management.  Patient states he does not have any education material, transition of care, care coordination, transportation, community resource, or pharmacy needs at this time. States he is very appreciative of the follow up and is in agreement to receive [place] Management information.   Objective: Per chart review, patient hospitalized 12/31/16 -01/01/17 for Osteoarthritis of the Right hip.  Status post  RIGHT TOTAL HIP ARTHROPLASTY ANTERIOR APPROACH on 12/31/16.   Assessment: Received UMR Preoperative Call referral on 12/18/16.  Preoperative call completed, and transition of care follow up completed, and will proceed with case closure.   Plan: RNCM will send patient successful outreach letter, [facility] pamphlet, and  magnet. RNCM will send [employer] / Link to Wellness referral to [facility].   RNCM will send case closure due to follow up completed / no care management needs request to [facility] [place] [facility].    Geanette Buonocore H. Annia Friendly, BSN, [place] Management [facility] Telephonic CM Phone: [phone] Fax: [phone]

## 2017-01-07 DIAGNOSIS — Z7984 Long term (current) use of oral hypoglycemic drugs: Secondary | ICD-10-CM | POA: Diagnosis not present

## 2017-01-07 DIAGNOSIS — Z471 Aftercare following joint replacement surgery: Secondary | ICD-10-CM | POA: Diagnosis not present

## 2017-01-07 DIAGNOSIS — E119 Type 2 diabetes mellitus without complications: Secondary | ICD-10-CM | POA: Diagnosis not present

## 2017-01-07 DIAGNOSIS — Z9181 History of falling: Secondary | ICD-10-CM | POA: Diagnosis not present

## 2017-01-07 DIAGNOSIS — Z7901 Long term (current) use of anticoagulants: Secondary | ICD-10-CM | POA: Diagnosis not present

## 2017-01-07 DIAGNOSIS — Z96641 Presence of right artificial hip joint: Secondary | ICD-10-CM | POA: Diagnosis not present

## 2017-01-07 DIAGNOSIS — I10 Essential (primary) hypertension: Secondary | ICD-10-CM | POA: Diagnosis not present

## 2017-01-09 DIAGNOSIS — Z7984 Long term (current) use of oral hypoglycemic drugs: Secondary | ICD-10-CM | POA: Diagnosis not present

## 2017-01-09 DIAGNOSIS — I10 Essential (primary) hypertension: Secondary | ICD-10-CM | POA: Diagnosis not present

## 2017-01-09 DIAGNOSIS — Z9181 History of falling: Secondary | ICD-10-CM | POA: Diagnosis not present

## 2017-01-09 DIAGNOSIS — Z96641 Presence of right artificial hip joint: Secondary | ICD-10-CM | POA: Diagnosis not present

## 2017-01-09 DIAGNOSIS — E119 Type 2 diabetes mellitus without complications: Secondary | ICD-10-CM | POA: Diagnosis not present

## 2017-01-09 DIAGNOSIS — Z471 Aftercare following joint replacement surgery: Secondary | ICD-10-CM | POA: Diagnosis not present

## 2017-01-09 DIAGNOSIS — Z7901 Long term (current) use of anticoagulants: Secondary | ICD-10-CM | POA: Diagnosis not present

## 2017-01-12 DIAGNOSIS — Z7984 Long term (current) use of oral hypoglycemic drugs: Secondary | ICD-10-CM | POA: Diagnosis not present

## 2017-01-12 DIAGNOSIS — Z96641 Presence of right artificial hip joint: Secondary | ICD-10-CM | POA: Diagnosis not present

## 2017-01-12 DIAGNOSIS — Z9181 History of falling: Secondary | ICD-10-CM | POA: Diagnosis not present

## 2017-01-12 DIAGNOSIS — Z471 Aftercare following joint replacement surgery: Secondary | ICD-10-CM | POA: Diagnosis not present

## 2017-01-12 DIAGNOSIS — I10 Essential (primary) hypertension: Secondary | ICD-10-CM | POA: Diagnosis not present

## 2017-01-12 DIAGNOSIS — E119 Type 2 diabetes mellitus without complications: Secondary | ICD-10-CM | POA: Diagnosis not present

## 2017-01-12 DIAGNOSIS — Z7901 Long term (current) use of anticoagulants: Secondary | ICD-10-CM | POA: Diagnosis not present

## 2017-01-20 MED FILL — oxyCODONE HCL 5 MG TABS: 5 | 8 days supply | Qty: 60 | Fill #0

## 2017-02-03 DIAGNOSIS — M1611 Unilateral primary osteoarthritis, right hip: Secondary | ICD-10-CM | POA: Diagnosis not present

## 2017-02-09 MED FILL — IRBESARTAN 300 MG TABLET: 300 | 90 days supply | Qty: 90 | Fill #0

## 2017-02-09 MED FILL — ROSUVASTATIN CALCIUM 5 MG T: 5 | 90 days supply | Qty: 90 | Fill #0

## 2017-02-10 MED FILL — AMLODIPINE BESYLATE 10 MG T: 10 | 90 days supply | Qty: 90 | Fill #0

## 2017-03-26 DIAGNOSIS — E785 Hyperlipidemia, unspecified: Secondary | ICD-10-CM | POA: Diagnosis not present

## 2017-03-26 DIAGNOSIS — D649 Anemia, unspecified: Secondary | ICD-10-CM | POA: Diagnosis not present

## 2017-03-26 DIAGNOSIS — E119 Type 2 diabetes mellitus without complications: Secondary | ICD-10-CM | POA: Diagnosis not present

## 2017-03-26 DIAGNOSIS — I10 Essential (primary) hypertension: Secondary | ICD-10-CM | POA: Diagnosis not present

## 2017-05-07 MED FILL — IRBESARTAN 300 MG TABLET: 300 | 90 days supply | Qty: 90 | Fill #0

## 2017-05-07 MED FILL — ROSUVASTATIN CALCIUM 5 MG T: 5 | 90 days supply | Qty: 90 | Fill #0

## 2017-05-07 MED FILL — metFORMIN HCL 500 MG TABS: 500 | 30 days supply | Qty: 90 | Fill #0

## 2017-05-07 MED FILL — AMLODIPINE BESYLATE 10 MG T: 10 | 90 days supply | Qty: 90 | Fill #0

## 2017-05-20 MED FILL — METFORMIN HCL ER 500 MG TAB: 500 | 30 days supply | Qty: 90 | Fill #0

## 2017-05-21 DIAGNOSIS — N509 Disorder of male genital organs, unspecified: Secondary | ICD-10-CM | POA: Diagnosis not present

## 2017-05-21 DIAGNOSIS — N401 Enlarged prostate with lower urinary tract symptoms: Secondary | ICD-10-CM | POA: Diagnosis not present

## 2017-05-21 DIAGNOSIS — R350 Frequency of micturition: Secondary | ICD-10-CM | POA: Diagnosis not present

## 2017-05-29 ENCOUNTER — Other Ambulatory Visit: Payer: Self-pay | Admitting: Urology

## 2017-06-04 ENCOUNTER — Other Ambulatory Visit: Payer: Self-pay

## 2017-06-04 ENCOUNTER — Encounter (HOSPITAL_BASED_OUTPATIENT_CLINIC_OR_DEPARTMENT_OTHER): Payer: Self-pay

## 2017-06-04 NOTE — Progress Notes (Signed)
Spoke with:  Jaci Standard NPO:  After Midnight, no gum, candy, or mints   Arrival time:  0700 Labs: Istat 4, Hgb A1C,  AM medications: Amlodipine, Irbesartan Pre op orders:  Yes Ride home: Jay Porter (wife) [phone]

## 2017-06-15 NOTE — H&P (Signed)
Office Visit Report     05/21/2017   --------------------------------------------------------------------------------   Jay Porter  MRN: [id number]  PRIMARY CARE:    DOB: 12/09/1945, 72 year old Male  REFERRING:    SSN: [id number]  PROVIDER:  [name], M.D.    LOCATION:  [facility] [phone]   --------------------------------------------------------------------------------   CC: I have symptoms of an enlarged prostate.  HPI: Jay Porter is a 72 year-old male patient who is here for symptoms of enlarged prostate.  He first noticed the symptoms approximately 04/28/2016. His symptoms have not gotten worse over the last year. He has not been treated with medications in the past for his lower urinary tract symptoms. The patient has never had a surgical procedure for bladder outlet obstruction to his prostate.   Patient complained of urinary frequency and urgency. His AUA symptom score is 7, mixed. Primary urgency and frequency. Nocturia 1. PVR 12.  No h/o BPH. No NG risk.  Prior PSA 1.2.   He also complained about a purple growth at the meatus. Noted Sep 2018 after a hip replacement and he had a foley. The area may be getting bigger. He noted some blood after the foley was removed. No UTI's.   Modifying factors: There are no other modifying factors  Associated signs and symptoms: There are no other associated signs and symptoms  Aggravating and relieving factors: There are no other aggravating or relieving factors  Severity: Moderate  Duration: Persistent      ALLERGIES: Ace Inhibitors    MEDICATIONS: Crestor 5 mg tablet  Metformin Hcl 1,000 mg tablet  Amlodipine Besylate 10 mg tablet  Irbesartan 300 mg tablet     GU PSH: None   NON-GU PSH: Hip Replacement, Right    GU PMH: None   NON-GU PMH: Diabetes Type 2 Hypercholesterolemia Hypertension Osteoarthritis of hip, unspecified    FAMILY HISTORY: Deceased - Father Diabetes -  Father Hypertension - Father Prostate Cancer - Grandfather Renal Disease - Father renal failure - Father   SOCIAL HISTORY: Marital Status: Married Preferred Language: English; Ethnicity: Not Hispanic Or Latino; Race: Black or African American Current Smoking Status: Patient has never smoked.   Tobacco Use Assessment Completed: Used Tobacco in last 30 days? Drinks 2 drinks per week.  Drinks 2 caffeinated drinks per day. 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    REVIEW OF SYSTEMS:    GU Review Male:   Patient reports frequent urination, hard to postpone urination, get up at night to urinate, leakage of urine, and erection problems. Patient denies burning/ pain with urination, stream starts and stops, trouble starting your stream, have to strain to urinate , and penile pain.  Gastrointestinal (Upper):   Patient denies nausea, vomiting, and indigestion/ heartburn.  Gastrointestinal (Lower):   Patient denies diarrhea and constipation.  Constitutional:   Patient denies fever, night sweats, weight loss, and fatigue.  Skin:   Patient reports skin rash/ lesion. Patient denies itching.  Eyes:   Patient denies blurred vision and double vision.  Ears/ Nose/ Throat:   Patient denies sore throat and sinus problems.  Hematologic/Lymphatic:   Patient denies swollen glands and easy bruising.  Cardiovascular:   Patient denies leg swelling and chest pains.  Respiratory:   Patient denies cough and shortness of breath.  Endocrine:   Patient denies excessive thirst.  Musculoskeletal:   Patient reports joint pain. Patient denies back pain.  Neurological:   Patient denies headaches and dizziness.  Psychologic:   Patient denies depression and anxiety.  VITAL SIGNS:      05/21/2017 08:39 AM  Weight 172 lb / 78.02 kg  Height 66 in / 167.64 cm  BP 112/68 mmHg  Pulse 81 /min  BMI 27.8 kg/m   GU PHYSICAL EXAMINATION:    Anus and Perineum: No hemorrhoids. No anal stenosis. No rectal fissure, no  anal fissure. No edema, no dimple, no perineal tenderness, no anal tenderness.  Scrotum: No lesions. No edema. No cysts. No warts.  Epididymides: Right: no spermatocele, no masses, no cysts, no tenderness, no induration, no enlargement. Left: no spermatocele, no masses, no cysts, no tenderness, no induration, no enlargement.  Testes: No tenderness, no swelling, no enlargement left testes. No tenderness, no swelling, no enlargement right testes. Normal location left testes. Normal location right testes. No mass, no cyst, no varicocele, no hydrocele left testes. No mass, no cyst, no varicocele, no hydrocele right testes.  Urethral Meatus: Urethral meatal lesion - flat, smooth, pigmented wrapping around the top of the meatus from about 11:00 to 1:00. Normal size. No wart, no polyp, no balanitis, no discharge. Normal location.   Penis: Circumcised, no warts, no cracks. No dorsal Peyronie's plaques, no left corporal Peyronie's plaques, no right corporal Peyronie's plaques, no scarring, no warts. No balanitis, no meatal stenosis.  Prostate: Prostate about 30 grams. Left lobe normal consistency, right lobe normal consistency. Symmetrical lobes. No prostate nodule. Left lobe no tenderness, right lobe no tenderness.   Seminal Vesicles: Nonpalpable.  Sphincter Tone: Normal sphincter. No rectal tenderness. No rectal mass.    MULTI-SYSTEM PHYSICAL EXAMINATION:    Constitutional: Well-nourished. No physical deformities. Normally developed. Good grooming.  Neck: Neck symmetrical, not swollen. Normal tracheal position.  Respiratory: No labored breathing, no use of accessory muscles.   Cardiovascular: Normal temperature, normal extremity pulses, no swelling, no varicosities.  Lymphatic: No enlargement of neck, axillae, groin.  Skin: No paleness, no jaundice, no cyanosis. No lesion, no ulcer, no rash.  Neurologic / Psychiatric: Oriented to time, oriented to place, oriented to person. No depression, no anxiety, no  agitation.  Gastrointestinal: No mass, no tenderness, no rigidity, non obese abdomen.  Eyes: Normal conjunctivae. Normal eyelids.  Ears, Nose, Mouth, and Throat: Left ear no scars, no lesions, no masses. Right ear no scars, no lesions, no masses. Nose no scars, no lesions, no masses. Normal hearing. Normal lips.  Musculoskeletal: Normal gait and station of head and neck.     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 DRE normal. Discussed some options such as PT/OT, alpha blockers, OAB meds. He'll consider.   meatal lesion - discussed the nature risks and benefits of surveillance versus biopsy. Patient concerned and wants to make sure there is no "malignancy". He otherwise had a normal exam. No lymphadenopathy. We'll plan for a wedge biopsy and frozen. If any concerns we could excise the lesion and close it similar to a meatoplasty. I drew him a picture. We discussed he could need a staged procedure and risks include meatal stenosis and poor cosmesis/scarring among others. All questions answered. He elected to proceed.       ** Signed by [name], M.D. on 05/21/17 at 5:09 PM (EST)**     The information contained in this medical record document  is considered private and confidential patient information. This information can only be used for the medical diagnosis and/or medical services that are being provided by the patient's selected caregivers. This information can only be distributed outside of the patient's care if the patient agrees and signs waivers of authorization for this information to be sent to an outside source or route.  Add: PSA was 0.36.

## 2017-06-16 ENCOUNTER — Encounter (HOSPITAL_BASED_OUTPATIENT_CLINIC_OR_DEPARTMENT_OTHER): Payer: Self-pay | Admitting: *Deleted

## 2017-06-16 ENCOUNTER — Ambulatory Visit (HOSPITAL_BASED_OUTPATIENT_CLINIC_OR_DEPARTMENT_OTHER): Payer: 59 | Admitting: Anesthesiology

## 2017-06-16 ENCOUNTER — Other Ambulatory Visit: Payer: Self-pay

## 2017-06-16 ENCOUNTER — Ambulatory Visit (HOSPITAL_BASED_OUTPATIENT_CLINIC_OR_DEPARTMENT_OTHER)
Admission: RE | Admit: 2017-06-16 | Discharge: 2017-06-16 | Disposition: A | Discharge status: Home / Self Care | Payer: 59 | Source: Ambulatory Visit | Attending: Urology | Admitting: Urology

## 2017-06-16 ENCOUNTER — Encounter (HOSPITAL_BASED_OUTPATIENT_CLINIC_OR_DEPARTMENT_OTHER)
Admission: RE | Disposition: A | Discharge status: Home / Self Care | Payer: Self-pay | Source: Ambulatory Visit | Attending: Urology

## 2017-06-16 DIAGNOSIS — R350 Frequency of micturition: Secondary | ICD-10-CM | POA: Diagnosis not present

## 2017-06-16 DIAGNOSIS — L819 Disorder of pigmentation, unspecified: Secondary | ICD-10-CM

## 2017-06-16 DIAGNOSIS — Z79899 Other long term (current) drug therapy: Secondary | ICD-10-CM | POA: Diagnosis not present

## 2017-06-16 DIAGNOSIS — Z96641 Presence of right artificial hip joint: Secondary | ICD-10-CM | POA: Insufficient documentation

## 2017-06-16 DIAGNOSIS — E119 Type 2 diabetes mellitus without complications: Secondary | ICD-10-CM | POA: Diagnosis not present

## 2017-06-16 DIAGNOSIS — Z841 Family history of disorders of kidney and ureter: Secondary | ICD-10-CM | POA: Diagnosis not present

## 2017-06-16 DIAGNOSIS — L814 Other melanin hyperpigmentation: Secondary | ICD-10-CM | POA: Diagnosis not present

## 2017-06-16 DIAGNOSIS — Z8042 Family history of malignant neoplasm of prostate: Secondary | ICD-10-CM | POA: Diagnosis not present

## 2017-06-16 DIAGNOSIS — Z888 Allergy status to other drugs, medicaments and biological substances status: Secondary | ICD-10-CM | POA: Diagnosis not present

## 2017-06-16 DIAGNOSIS — I10 Essential (primary) hypertension: Secondary | ICD-10-CM | POA: Insufficient documentation

## 2017-06-16 DIAGNOSIS — Z8249 Family history of ischemic heart disease and other diseases of the circulatory system: Secondary | ICD-10-CM | POA: Insufficient documentation

## 2017-06-16 DIAGNOSIS — E7800 Pure hypercholesterolemia, unspecified: Secondary | ICD-10-CM | POA: Insufficient documentation

## 2017-06-16 DIAGNOSIS — N401 Enlarged prostate with lower urinary tract symptoms: Secondary | ICD-10-CM | POA: Insufficient documentation

## 2017-06-16 DIAGNOSIS — R3915 Urgency of urination: Secondary | ICD-10-CM | POA: Insufficient documentation

## 2017-06-16 DIAGNOSIS — M1610 Unilateral primary osteoarthritis, unspecified hip: Secondary | ICD-10-CM | POA: Diagnosis not present

## 2017-06-16 DIAGNOSIS — L818 Other specified disorders of pigmentation: Secondary | ICD-10-CM | POA: Diagnosis present

## 2017-06-16 DIAGNOSIS — Z833 Family history of diabetes mellitus: Secondary | ICD-10-CM | POA: Insufficient documentation

## 2017-06-16 DIAGNOSIS — Z7984 Long term (current) use of oral hypoglycemic drugs: Secondary | ICD-10-CM | POA: Diagnosis not present

## 2017-06-16 DIAGNOSIS — D290 Benign neoplasm of penis: Secondary | ICD-10-CM | POA: Diagnosis not present

## 2017-06-16 HISTORY — DX: Personal history of colonic polyps: Z86.010

## 2017-06-16 HISTORY — PX: URETERAL BIOPSY: SHX6688

## 2017-06-16 HISTORY — DX: Other specified bacterial intestinal infections: A04.8

## 2017-06-16 HISTORY — DX: Deficiency of other specified B group vitamins: E53.8

## 2017-06-16 HISTORY — DX: Unspecified asthma, uncomplicated: J45.909

## 2017-06-16 HISTORY — DX: Thrombocytopenia, unspecified: D69.6

## 2017-06-16 HISTORY — DX: Hyperlipidemia, unspecified: E78.5

## 2017-06-16 LAB — POCT I-STAT 4, (NA,K, GLUC, HGB,HCT)
GLUCOSE: 131 mg/dL — AB (ref 65–99)
HCT: 42 % (ref 39.0–52.0)
HEMOGLOBIN: 14.3 g/dL (ref 13.0–17.0)
POTASSIUM: 3.6 mmol/L (ref 3.5–5.1)
Sodium: 144 mmol/L (ref 135–145)

## 2017-06-16 LAB — GLUCOSE, CAPILLARY: Glucose-Capillary: 151 mg/dL — ABNORMAL HIGH (ref 65–99)

## 2017-06-16 SURGERY — BIOPSY, URETER
Anesthesia: General | Site: Penis

## 2017-06-16 MED ORDER — PROPOFOL 10 MG/ML IV BOLUS
INTRAVENOUS | Status: AC
  Filled 2017-06-16: qty 20

## 2017-06-16 MED ORDER — PROPOFOL 10 MG/ML IV BOLUS
INTRAVENOUS | Status: DC | PRN
  Administered 2017-06-16: 150 mg via INTRAVENOUS

## 2017-06-16 MED ORDER — CEFAZOLIN SODIUM-DEXTROSE 2-4 GM/100ML-% IV SOLN
2.0000 g | Freq: Once | INTRAVENOUS | Status: AC
  Administered 2017-06-16: 2 g via INTRAVENOUS
  Filled 2017-06-16: qty 100

## 2017-06-16 MED ORDER — ACETAMINOPHEN 500 MG PO TABS
ORAL_TABLET | ORAL | Status: AC
Start: 2017-06-16 — End: ?
  Filled 2017-06-16: qty 2

## 2017-06-16 MED ORDER — FENTANYL CITRATE (PF) 100 MCG/2ML IJ SOLN
INTRAMUSCULAR | Status: AC
  Filled 2017-06-16: qty 2

## 2017-06-16 MED ORDER — ACETAMINOPHEN 10 MG/ML IV SOLN
1000.0000 mg | Freq: Once | INTRAVENOUS | Status: DC | PRN
  Filled 2017-06-16: qty 100

## 2017-06-16 MED ORDER — BUPIVACAINE HCL 0.25 % IJ SOLN
INTRAMUSCULAR | Status: DC | PRN
  Administered 2017-06-16: 10 mL

## 2017-06-16 MED ORDER — PHENYLEPHRINE 40 MCG/ML (10ML) SYRINGE FOR IV PUSH (FOR BLOOD PRESSURE SUPPORT)
PREFILLED_SYRINGE | INTRAVENOUS | Status: AC
  Filled 2017-06-16: qty 10

## 2017-06-16 MED ORDER — DEXAMETHASONE SODIUM PHOSPHATE 10 MG/ML IJ SOLN
INTRAMUSCULAR | Status: AC
  Filled 2017-06-16: qty 1

## 2017-06-16 MED ORDER — MEPERIDINE HCL 25 MG/ML IJ SOLN
6.2500 mg | INTRAMUSCULAR | Status: DC | PRN
  Filled 2017-06-16: qty 1

## 2017-06-16 MED ORDER — LIDOCAINE HCL (CARDIAC) 20 MG/ML IV SOLN
INTRAVENOUS | Status: DC | PRN
  Administered 2017-06-16: 100 mg via INTRAVENOUS

## 2017-06-16 MED ORDER — HYDROCODONE-ACETAMINOPHEN 7.5-325 MG PO TABS
1.0000 | ORAL_TABLET | Freq: Once | ORAL | Status: DC | PRN
  Filled 2017-06-16: qty 1

## 2017-06-16 MED ORDER — FENTANYL CITRATE (PF) 100 MCG/2ML IJ SOLN
INTRAMUSCULAR | Status: DC | PRN
  Administered 2017-06-16: 50 ug via INTRAVENOUS
  Administered 2017-06-16: 100 ug via INTRAVENOUS

## 2017-06-16 MED ORDER — ACETAMINOPHEN 500 MG PO TABS
1000.0000 mg | ORAL_TABLET | Freq: Once | ORAL | Status: AC
  Administered 2017-06-16: 1000 mg via ORAL
  Filled 2017-06-16: qty 2

## 2017-06-16 MED ORDER — ONDANSETRON HCL 4 MG/2ML IJ SOLN
INTRAMUSCULAR | Status: AC
  Filled 2017-06-16: qty 2

## 2017-06-16 MED ORDER — TRIPLE ANTIBIOTIC 3.5-400-5000 EX OINT
TOPICAL_OINTMENT | CUTANEOUS | Status: DC | PRN
  Administered 2017-06-16: 1 via TOPICAL

## 2017-06-16 MED ORDER — LACTATED RINGERS IV SOLN
INTRAVENOUS | Status: DC
  Administered 2017-06-16: 08:00:00 via INTRAVENOUS
  Filled 2017-06-16: qty 1000

## 2017-06-16 MED ORDER — CEFAZOLIN SODIUM-DEXTROSE 2-4 GM/100ML-% IV SOLN
INTRAVENOUS | Status: AC
  Filled 2017-06-16: qty 100

## 2017-06-16 MED ORDER — PHENYLEPHRINE HCL 10 MG/ML IJ SOLN
INTRAMUSCULAR | Status: DC | PRN
  Administered 2017-06-16 (×4): 80 ug via INTRAVENOUS

## 2017-06-16 MED ORDER — DEXAMETHASONE SODIUM PHOSPHATE 10 MG/ML IJ SOLN
INTRAMUSCULAR | Status: DC | PRN
  Administered 2017-06-16: 6 mg via INTRAVENOUS

## 2017-06-16 MED ORDER — LIDOCAINE 2% (20 MG/ML) 5 ML SYRINGE
INTRAMUSCULAR | Status: AC
  Filled 2017-06-16: qty 5

## 2017-06-16 MED ORDER — HYDROMORPHONE HCL 1 MG/ML IJ SOLN
0.2500 mg | INTRAMUSCULAR | Status: DC | PRN
Start: 2017-06-16 — End: 2017-06-16
  Filled 2017-06-16: qty 0.5

## 2017-06-16 MED ORDER — ONDANSETRON HCL 4 MG/2ML IJ SOLN
INTRAMUSCULAR | Status: DC | PRN
  Administered 2017-06-16: 4 mg via INTRAVENOUS

## 2017-06-16 SURGICAL SUPPLY — 35 items
BANDAGE COBAN STERILE 2 (GAUZE/BANDAGES/DRESSINGS) ×2 IMPLANT
BLADE SURG 15 STRL LF DISP TIS (BLADE) ×1 IMPLANT
BLADE SURG 15 STRL SS (BLADE) ×2
BNDG COHESIVE 1X5 TAN STRL LF (GAUZE/BANDAGES/DRESSINGS) ×2 IMPLANT
BNDG CONFORM 2 STRL LF (GAUZE/BANDAGES/DRESSINGS) ×2 IMPLANT
COVER BACK TABLE 60X90IN (DRAPES) ×2 IMPLANT
COVER MAYO STAND STRL (DRAPES) ×2 IMPLANT
DRAIN PENROSE 18X1/4 LTX STRL (WOUND CARE) ×2 IMPLANT
DRAPE LAPAROTOMY 100X72 PEDS (DRAPES) ×2 IMPLANT
DRSG TELFA 3X8 NADH (GAUZE/BANDAGES/DRESSINGS) ×2 IMPLANT
DRSG VASELINE 3X18 (GAUZE/BANDAGES/DRESSINGS) ×2 IMPLANT
ELECT REM PT RETURN 9FT ADLT (ELECTROSURGICAL) ×2
ELECTRODE REM PT RTRN 9FT ADLT (ELECTROSURGICAL) ×1 IMPLANT
GAUZE VASELINE 1X8 (GAUZE/BANDAGES/DRESSINGS) ×2 IMPLANT
GLOVE BIO SURGEON STRL SZ7.5 (GLOVE) ×2 IMPLANT
GOWN STRL REUS W/ TWL LRG LVL3 (GOWN DISPOSABLE) ×1 IMPLANT
GOWN STRL REUS W/ TWL XL LVL3 (GOWN DISPOSABLE) ×1 IMPLANT
GOWN STRL REUS W/TWL LRG LVL3 (GOWN DISPOSABLE) ×2
GOWN STRL REUS W/TWL XL LVL3 (GOWN DISPOSABLE) ×2
KIT RM TURNOVER CYSTO AR (KITS) ×2 IMPLANT
MANIFOLD NEPTUNE II (INSTRUMENTS) IMPLANT
NEEDLE HYPO 25X1 1.5 SAFETY (NEEDLE) ×2 IMPLANT
NS IRRIG 500ML POUR BTL (IV SOLUTION) ×2 IMPLANT
PACK BASIN DAY SURGERY FS (CUSTOM PROCEDURE TRAY) ×2 IMPLANT
PENCIL BUTTON HOLSTER BLD 10FT (ELECTRODE) ×2 IMPLANT
SUT CHROMIC 3 0 PS 2 (SUTURE) IMPLANT
SUT CHROMIC 3 0 SH 27 (SUTURE) IMPLANT
SUT SILK 2 0 (SUTURE)
SUT SILK 2-0 18XBRD TIE 12 (SUTURE) IMPLANT
SUT VIC AB 4-0 RB1 27 (SUTURE) ×2
SUT VIC AB 4-0 RB1 27X BRD (SUTURE) ×1 IMPLANT
SYR CONTROL 10ML LL (SYRINGE) ×2 IMPLANT
TRAY DSU PREP LF (CUSTOM PROCEDURE TRAY) ×2 IMPLANT
TUBE CONNECTING 12X1/4 (SUCTIONS) IMPLANT
WATER STERILE IRR 500ML POUR (IV SOLUTION) IMPLANT

## 2017-06-16 NOTE — Op Note (Signed)
Preoperative diagnosis: Pigmented meatal skin lesion of glans Postoperative diagnosis: Same  Procedure: Excisional biopsy of glans/meatal pigmented skin lesion, 9 mm  Surgeon: Junious Silk  Anesthesia: General and local block  Indication for procedure: 72 year old who noted a pigmented lesion at the dorsal meatus that went from about 10:00 around 1:00 on the dorsal glans about at the edge of the meatus.  There was no erythema or white patches, meatal stenosis or other inflammatory change.  The patient favored excisional biopsy and did not wish to continue surveillance.  Findings: On loop examination in the OR the lesion appeared superficial and did not appear to advance into the mucosa of the fossa navicularis.  Description of procedure: After consent was obtained the patient brought to the operating room.  The penis was prepped and draped in the usual sterile fashion.  A 6 cc dorsal penile block was placed with quarter percent Marcaine.  A loose tourniquet was placed around the penis and the glans elevated on a towel.  Using loupe magnification and countertraction by came up from the 1:00 margin to 12:00 and then down around the right side of the lesion.  I shaved off underneath the epithelium off the spongy tissue.  I then came to the inner edge and came back up and over and excised the lesion in its entirety.  I was pleased with the margins and the depth.  This was sent for frozen section.  In the meantime the wound was irrigated and started at 12:00 and everted the mucosa of the fossa navicularis to the epithelium of the glans to re-create the meatus using a 4-0 Vicryl suture on a tapered RB needle.  I then placed a suture to evert the mucosa at  11:00 and 1:00.  The remainder of the lateral incision was closed with a single interrupted 4-0 Vicryl reapproximating the epithelium out on the right side and one on the left side.  One additional suture was placed on the right side to evert the mucosa to the  epithelium.  This created an excellent reconstruction of the meatus.  The tourniquet was released and excellent hemostasis was obtained.  The skin and the glans remained pink and viable the entire time.  The penis was covered with a moist gauze.  I spoke with pathologist and this appeared to be benign pigmented skin.  There may be additional finding of some lichen sclerosis type inflammatory change which would not be uncommon around the meatus.  Antibiotic ointment was placed over the incision and sutures in just inside the mid covered with Telfa.  He was awakened and taken to the recovery room in stable condition.  Complications: None  Blood loss: Minimal  Specimens: Pigmented glans/meatal skin lesion the pathology  Drains: None  Disposition: Patient stable to PACU

## 2017-06-16 NOTE — Anesthesia Postprocedure Evaluation (Signed)
Anesthesia Post Note  Patient: Jay Porter  Procedure(s) Performed: URETHRAL BIOPSY (N/A Penis)     Patient location during evaluation: PACU Anesthesia Type: General Level of consciousness: awake and alert Pain management: pain level controlled Vital Signs Assessment: post-procedure vital signs reviewed and stable Respiratory status: spontaneous breathing, nonlabored ventilation, respiratory function stable and patient connected to nasal cannula oxygen Cardiovascular status: blood pressure returned to baseline and stable Postop Assessment: no apparent nausea or vomiting Anesthetic complications: no    Last Vitals:  Vitals:   06/16/17 1037 06/16/17 1157  BP: 106/82 106/60  Pulse: 70 65  Resp: 14 16  Temp: 36.7 C 36.7 C  SpO2: 94% 95%    Last Pain:  Vitals:   06/16/17 0701  TempSrc: Oral                 Barnet Glasgow

## 2017-06-16 NOTE — Interval H&P Note (Signed)
History and Physical Interval Note:  06/16/2017 9:10 AM  Jay Porter  has presented today for surgery, with the diagnosis of Meatal LESION  The various methods of treatment have been discussed with the patient and family. After consideration of risks, benefits and other options for treatment, the patient has consented to  Procedure(s): URETHRAL BIOPSY (N/A) /meatal biopsy as a surgical intervention .  The patient is doing well.  He said clear urine without dysuria or gross hematuria.  No cough, congestion or fever.  The patient's history has been reviewed, patient examined, no change in status, stable for surgery.  We had discussed biopsy a portion of the lesion for a frozen and then removing the remainder if malignant, but it may be more technically feasible to remove the lesion en bloc instead of in 2 pieces.  I drew them a picture of the anatomy and we could close the defect similar to meatoplasty.  We discussed again risks of poor cosmesis, scar tissue and spraying of urine when he voids.  This could be permanent we discussed.  All questions answered.  He elects to proceed.  I have reviewed the patient's chart and labs.    [name]

## 2017-06-16 NOTE — Anesthesia Postprocedure Evaluation (Signed)
Anesthesia Post Note  Patient: Jay Porter  Procedure(s) Performed: URETHRAL BIOPSY (N/A Penis)     Anesthesia Post Evaluation  Last Vitals:  Vitals:   06/16/17 0701 06/16/17 1037  BP: 124/66 106/82  Pulse: 68 70  Resp: 16 14  Temp: 36.9 C 36.7 C  SpO2: 99% 94%    Last Pain:  Vitals:   06/16/17 0701  TempSrc: Oral                 Barnet Glasgow

## 2017-06-16 NOTE — Anesthesia Procedure Notes (Signed)
Procedure Name: LMA Insertion Date/Time: 06/16/2017 9:23 AM Performed by: Jonna Munro, CRNA Pre-anesthesia Checklist: Patient identified, Emergency Drugs available, Suction available, Patient being monitored and Timeout performed Patient Re-evaluated:Patient Re-evaluated prior to induction Oxygen Delivery Method: Circle system utilized Preoxygenation: Pre-oxygenation with 100% oxygen Induction Type: IV induction LMA: LMA inserted LMA Size: 5.0 Tube type: Oral Number of attempts: 1 Placement Confirmation: positive ETCO2 and breath sounds checked- equal and bilateral Tube secured with: Tape Dental Injury: Teeth and Oropharynx as per pre-operative assessment

## 2017-06-16 NOTE — Transfer of Care (Signed)
Immediate Anesthesia Transfer of Care Note  Patient: Jay Porter  Procedure(s) Performed: URETHRAL BIOPSY (N/A Penis)  Patient Location: PACU  Anesthesia Type:General  Level of Consciousness: awake, alert  and oriented  Airway & Oxygen Therapy: Patient Spontanous Breathing and Patient connected to nasal cannula oxygen  Post-op Assessment: Report given to RN and Post -op Vital signs reviewed and stable  Post vital signs: Reviewed and stable  Last Vitals:  Vitals:   06/16/17 0701  BP: 124/66  Pulse: 68  Resp: 16  Temp: 36.9 C  SpO2: 99%    Last Pain:  Vitals:   06/16/17 0701  TempSrc: Oral      Patients Stated Pain Goal: 7 (32/35/57 3220)  Complications: No apparent anesthesia complications

## 2017-06-16 NOTE — Discharge Instructions (Signed)
Meatal excisional biopsy, Adult, Care After This sheet gives you information about how to care for yourself after your procedure. Your doctor may also give you more specific instructions. If you have problems or questions, contact your doctor. Follow these instructions at home: Cut care  Follow instructions from your doctor about how to take care of your cut from surgery (incision). Make sure you: ? Wash your hands with soap and water before you change your bandage (dressing). If you cannot use soap and water, use hand sanitizer. ? Change your bandage as told by your doctor. ? Leave stitches (sutures), skin glue, or skin tape (adhesive) strips in place. They may need to stay in place for 2 weeks or longer. If tape strips get loose and curl up, you may trim the loose edges. Do not remove tape strips completely unless your doctor says it is okay  Check your cut area every day for signs of infection. Check for: ? More redness, swelling, or pain. ? More fluid or blood. ? Warmth. ? Pus or a bad smell. Bathing  Do not take baths, swim, or use a hot tub until your doctor says it is okay. Ask your doctor if you can take showers. You may only be allowed to take sponge baths for bathing.  Do not get your cut area wet during the 24 hours after the procedure, or as long as told by your doctor.  When you can shower, do not rub the cut area. Gently pat it dry. General instructions  Avoid heavy lifting, contact sports, biking, or swimming until your doctor says it is okay.  Do not have sex until your doctor says it is okay.  Take or apply over-the-counter and prescription medicines only as told by your doctor.  Keep all follow-up visits as told by your doctor. This is important. Contact a doctor if:  Medicine does not help your pain.  You have more redness, swelling, or pain around your cut.  You have more fluid or blood coming from your cut.  Your cut feels warm to the touch.  You have pus  or a bad smell coming from your cut.  You have a fever. Get help right away if:  You cannot pee (urinate).  It hurts to pee.  Your pain is not helped by medicines.  There is redness, swelling, and soreness that spreads up the shaft of your penis, your thighs, or your lower belly (abdomen).  You have bleeding that does not stop when you press on it. Summary  Follow instructions from your doctor about how to take care of your cut from surgery (incision).  Check your cut area every day for signs of infection.  Do not have sex until your doctor says it is okay. This information is not intended to replace advice given to you by your health care provider. Make sure you discuss any questions you have with your health care provider. Document Released: 10/01/2007 Document Revised: 04/22/2016 Document Reviewed: 04/22/2016 Elsevier Interactive Patient Education  2017 [place] [facility] Care Instructions  Activity: Get plenty of rest for the remainder of the day. A responsible individual must stay with you for 24 hours following the procedure.  For the next 24 hours, DO NOT: -Drive a car -Paediatric nurse -Drink alcoholic beverages -Take any medication unless instructed by your physician -Make any legal decisions or sign important papers.  Meals: Start with liquid foods such as gelatin or soup. Progress to regular foods as tolerated.  Avoid greasy, spicy, heavy foods. If nausea and/or vomiting occur, drink only clear liquids until the nausea and/or vomiting subsides. Call your physician if vomiting continues.  Special Instructions/Symptoms: Your throat may feel dry or sore from the anesthesia or the breathing tube placed in your throat during surgery. If this causes discomfort, gargle with warm salt water. The discomfort should disappear within 24 hours.  If you had a scopolamine patch placed behind your ear for the management of post- operative nausea and/or  vomiting:  1. The medication in the patch is effective for 72 hours, after which it should be removed.  Wrap patch in a tissue and discard in the trash. Wash hands thoroughly with soap and water. 2. You may remove the patch earlier than 72 hours if you experience unpleasant side effects which may include dry mouth, dizziness or visual disturbances. 3. Avoid touching the patch. Wash your hands with soap and water after contact with the patch.

## 2017-06-16 NOTE — Anesthesia Preprocedure Evaluation (Addendum)
Anesthesia Evaluation  Patient identified by MRN, date of birth, ID band Patient awake    Reviewed: Allergy & Precautions, NPO status , Patient's Chart, lab work & pertinent test results  Airway Mallampati: II  TM Distance: >3 FB Neck ROM: Full    Dental  (+) Edentulous Upper, Partial Lower   Pulmonary former smoker,    Pulmonary exam normal        Cardiovascular hypertension,  Rhythm:Regular Rate:Normal     Neuro/Psych negative neurological ROS     GI/Hepatic negative GI ROS, Neg liver ROS,   Endo/Other  negative endocrine ROSdiabetes  Renal/GU negative Renal ROS  negative genitourinary   Musculoskeletal   Abdominal   Peds  Hematology negative hematology ROS (+)   Anesthesia Other Findings   Reproductive/Obstetrics negative OB ROS                            Anesthesia Physical Anesthesia Plan  ASA: III  Anesthesia Plan: General   Post-op Pain Management:    Induction:   PONV Risk Score and Plan: 1 and Treatment may vary due to age or medical condition, Dexamethasone and Ondansetron  Airway Management Planned: LMA  Additional Equipment:   Intra-op Plan:   Post-operative Plan: Extubation in OR  Informed Consent: I have reviewed the patients History and Physical, chart, labs and discussed the procedure including the risks, benefits and alternatives for the proposed anesthesia with the patient or authorized representative who has indicated his/her understanding and acceptance.   Dental advisory given  Plan Discussed with: CRNA and Anesthesiologist  Anesthesia Plan Comments:         Anesthesia Quick Evaluation

## 2017-06-17 ENCOUNTER — Encounter (HOSPITAL_BASED_OUTPATIENT_CLINIC_OR_DEPARTMENT_OTHER): Payer: Self-pay | Admitting: Urology

## 2017-06-23 DIAGNOSIS — N509 Disorder of male genital organs, unspecified: Secondary | ICD-10-CM | POA: Diagnosis not present

## 2017-06-26 MED FILL — METFORMIN HCL ER 500 MG TAB: 500 | 60 days supply | Qty: 180 | Fill #0

## 2017-08-07 MED FILL — AMLODIPINE BESYLATE 10 MG T: 10 | 90 days supply | Qty: 90 | Fill #0

## 2017-08-07 MED FILL — IRBESARTAN 300 MG TABLET: 300 | 30 days supply | Qty: 30 | Fill #0

## 2017-08-20 DIAGNOSIS — J302 Other seasonal allergic rhinitis: Secondary | ICD-10-CM | POA: Diagnosis not present

## 2017-08-20 DIAGNOSIS — Z0000 Encounter for general adult medical examination without abnormal findings: Secondary | ICD-10-CM | POA: Diagnosis not present

## 2017-08-20 DIAGNOSIS — E785 Hyperlipidemia, unspecified: Secondary | ICD-10-CM | POA: Diagnosis not present

## 2017-08-20 DIAGNOSIS — E119 Type 2 diabetes mellitus without complications: Secondary | ICD-10-CM | POA: Diagnosis not present

## 2017-08-20 DIAGNOSIS — I10 Essential (primary) hypertension: Secondary | ICD-10-CM | POA: Diagnosis not present

## 2017-08-20 DIAGNOSIS — R7611 Nonspecific reaction to tuberculin skin test without active tuberculosis: Secondary | ICD-10-CM | POA: Diagnosis not present

## 2017-08-20 DIAGNOSIS — E538 Deficiency of other specified B group vitamins: Secondary | ICD-10-CM | POA: Diagnosis not present

## 2017-08-20 DIAGNOSIS — E559 Vitamin D deficiency, unspecified: Secondary | ICD-10-CM | POA: Diagnosis not present

## 2017-08-20 DIAGNOSIS — D696 Thrombocytopenia, unspecified: Secondary | ICD-10-CM | POA: Diagnosis not present

## 2017-08-20 DIAGNOSIS — R7989 Other specified abnormal findings of blood chemistry: Secondary | ICD-10-CM | POA: Diagnosis not present

## 2017-09-07 DIAGNOSIS — Z136 Encounter for screening for cardiovascular disorders: Secondary | ICD-10-CM | POA: Diagnosis not present

## 2017-09-07 DIAGNOSIS — R718 Other abnormality of red blood cells: Secondary | ICD-10-CM | POA: Diagnosis not present

## 2017-09-08 DIAGNOSIS — I10 Essential (primary) hypertension: Secondary | ICD-10-CM | POA: Diagnosis not present

## 2017-09-11 MED FILL — METFORMIN HCL ER 500 MG TAB: 500 | 60 days supply | Qty: 180 | Fill #1

## 2017-09-15 MED FILL — IRBESARTAN 300 MG TAB: 300 | 30 days supply | Qty: 30 | Fill #1

## 2017-10-12 MED FILL — PEG-3350 AND ELECTROLYTES S: 236 | 1 days supply | Qty: 4000 | Fill #0

## 2017-10-12 MED FILL — IRBESARTAN 300 MG TAB: 300 | 30 days supply | Qty: 30 | Fill #2

## 2017-10-12 MED FILL — ROSUVASTATIN CALCIUM 10 MG: 10 | 30 days supply | Qty: 30 | Fill #0

## 2017-10-14 DIAGNOSIS — D120 Benign neoplasm of cecum: Secondary | ICD-10-CM | POA: Diagnosis not present

## 2017-10-14 DIAGNOSIS — Z1211 Encounter for screening for malignant neoplasm of colon: Secondary | ICD-10-CM | POA: Diagnosis not present

## 2017-10-14 DIAGNOSIS — Z86010 Personal history of colonic polyps: Secondary | ICD-10-CM | POA: Diagnosis not present

## 2017-10-14 DIAGNOSIS — K635 Polyp of colon: Secondary | ICD-10-CM | POA: Diagnosis not present

## 2017-11-12 MED FILL — AMLODIPINE BESYLATE 10 MG T: 10 | 90 days supply | Qty: 90 | Fill #0

## 2017-11-12 MED FILL — METFORMIN HCL ER 500 MG TAB: 500 | 90 days supply | Qty: 180 | Fill #0

## 2017-11-12 MED FILL — IRBESARTAN 300 MG TAB: 300 | 30 days supply | Qty: 30 | Fill #0

## 2017-12-11 MED FILL — IRBESARTAN 300 MG TABLET: 300 | 90 days supply | Qty: 90 | Fill #0

## 2017-12-11 MED FILL — ROSUVASTATIN CALCIUM 10 MG: 10 | 30 days supply | Qty: 30 | Fill #1

## 2017-12-30 MED FILL — SILDENAFIL CITRATE 50 MG TA: 50 | 10 days supply | Qty: 10 | Fill #0

## 2017-12-30 MED FILL — ROSUVASTATIN CALCIUM 10 MG: 10 | 30 days supply | Qty: 30 | Fill #0

## 2018-01-28 MED FILL — SILDENAFIL CITRATE 50 MG TA: 50 | 30 days supply | Qty: 10 | Fill #0

## 2018-03-03 MED FILL — ROSUVASTATIN CALCIUM 10 MG: 10 | 30 days supply | Qty: 30 | Fill #0

## 2018-03-03 MED FILL — IRBESARTAN 300 MG TAB: 300 | 90 days supply | Qty: 90 | Fill #0

## 2018-03-03 MED FILL — metFORMIN HCL ER 500 MG TB2: 500 | 90 days supply | Qty: 180 | Fill #0

## 2018-04-13 MED FILL — ROSUVASTATIN CALCIUM 10 MG: 10 | 90 days supply | Qty: 90 | Fill #1

## 2018-05-07 MED FILL — SILDENAFIL CITRATE 50 MG TA: 50 | 10 days supply | Qty: 10 | Fill #1

## 2018-05-07 MED FILL — AMLODIPINE BESYLATE 10 MG T: 10 | 90 days supply | Qty: 90 | Fill #0

## 2018-06-11 ENCOUNTER — Ambulatory Visit (INDEPENDENT_AMBULATORY_CARE_PROVIDER_SITE_OTHER): Payer: Medicare Other | Admitting: Internal Medicine

## 2018-06-11 DIAGNOSIS — Z7189 Other specified counseling: Secondary | ICD-10-CM

## 2018-06-11 DIAGNOSIS — Z23 Encounter for immunization: Secondary | ICD-10-CM | POA: Diagnosis not present

## 2018-06-11 DIAGNOSIS — Z7185 Encounter for immunization safety counseling: Secondary | ICD-10-CM

## 2018-06-11 DIAGNOSIS — Z7184 Encounter for health counseling related to travel: Secondary | ICD-10-CM

## 2018-06-11 DIAGNOSIS — Z9189 Other specified personal risk factors, not elsewhere classified: Secondary | ICD-10-CM

## 2018-06-11 DIAGNOSIS — Z789 Other specified health status: Secondary | ICD-10-CM

## 2018-06-11 MED ORDER — AZITHROMYCIN 500 MG PO TABS: 500.0000 mg | ORAL_TABLET | Freq: Every day | ORAL | 0 refills | Status: DC

## 2018-06-11 MED ORDER — SCOPOLAMINE 1 MG/3DAYS TD PT72: 1.0000 | MEDICATED_PATCH | TRANSDERMAL | 0 refills | Status: DC

## 2018-06-11 MED ORDER — ACETAZOLAMIDE 125 MG PO TABS: 125.0000 mg | ORAL_TABLET | Freq: Two times a day (BID) | ORAL | 0 refills | Status: DC

## 2018-06-11 MED FILL — SCOPOLAMINE 1 MG/3DAYS PT72: 1 | 18 days supply | Qty: 6 | Fill #0

## 2018-06-11 MED FILL — AZITHROMYCIN 500 MG TABLET: 500 | 5 days supply | Qty: 5 | Fill #0

## 2018-06-11 MED FILL — acetaZOLAMIDE 125 MG TABS: 125 | 7 days supply | Qty: 14 | Fill #0

## 2018-06-11 NOTE — Progress Notes (Signed)
Subjective:   Jay Porter is a 73 y.o. male who presents to the Infectious Disease clinic for travel consultation. Planned departure date: feb 25     Countries of travel: [place] and [place] Areas in country: metro and rural Accommodations: hotels Purpose of travel: vacation Prior travel out of [place]: yes - [place], [place], [place], [place], [place], [place], [place]     Objective:   Medications:irbesartan, crestor, amlodipine, metformin Pmhx: htn, dm2, hld   Assessment:   No contraindications to travel. none   Plan:   Travel vaccines = recommend hep A #2. Otherwise uptodate  Altitude sickness = will give acetazolamide  Motion/sea sickness for [place] itinerary = will give scopolamine patch  Traveler's diarrhea = give rx for azithro

## 2018-06-16 MED FILL — metFORMIN HCL ER 500 MG TB2: 500 | 90 days supply | Qty: 180 | Fill #0

## 2018-06-16 MED FILL — IRBESARTAN 300 MG TAB: 300 | 90 days supply | Qty: 90 | Fill #0

## 2018-07-09 MED FILL — ROSUVASTATIN CALCIUM 10 MG: 10 | 60 days supply | Qty: 60 | Fill #2

## 2018-08-30 MED FILL — metFORMIN HCL ER 500 MG TB2: 500 | 60 days supply | Qty: 180 | Fill #0

## 2018-08-30 MED FILL — IRBESARTAN 300 MG TABLET: 300 | 90 days supply | Qty: 90 | Fill #0

## 2018-08-31 MED FILL — ROSUVASTATIN CALCIUM 10 MG: 10 | 30 days supply | Qty: 30 | Fill #0

## 2018-10-01 IMAGING — DX DG PORTABLE PELVIS
1 series · 2 of 2 positions shown · non-contrast
Comparison: Plain films right hip 12/19/2015.

CLINICAL DATA: Status post right hip replacement today.

EXAM:
PORTABLE PELVIS 1-2 VIEWS

[Series 1: pelvis ap · 0.14mm/px · 2 of 2 slices shown]
[im 1/2]
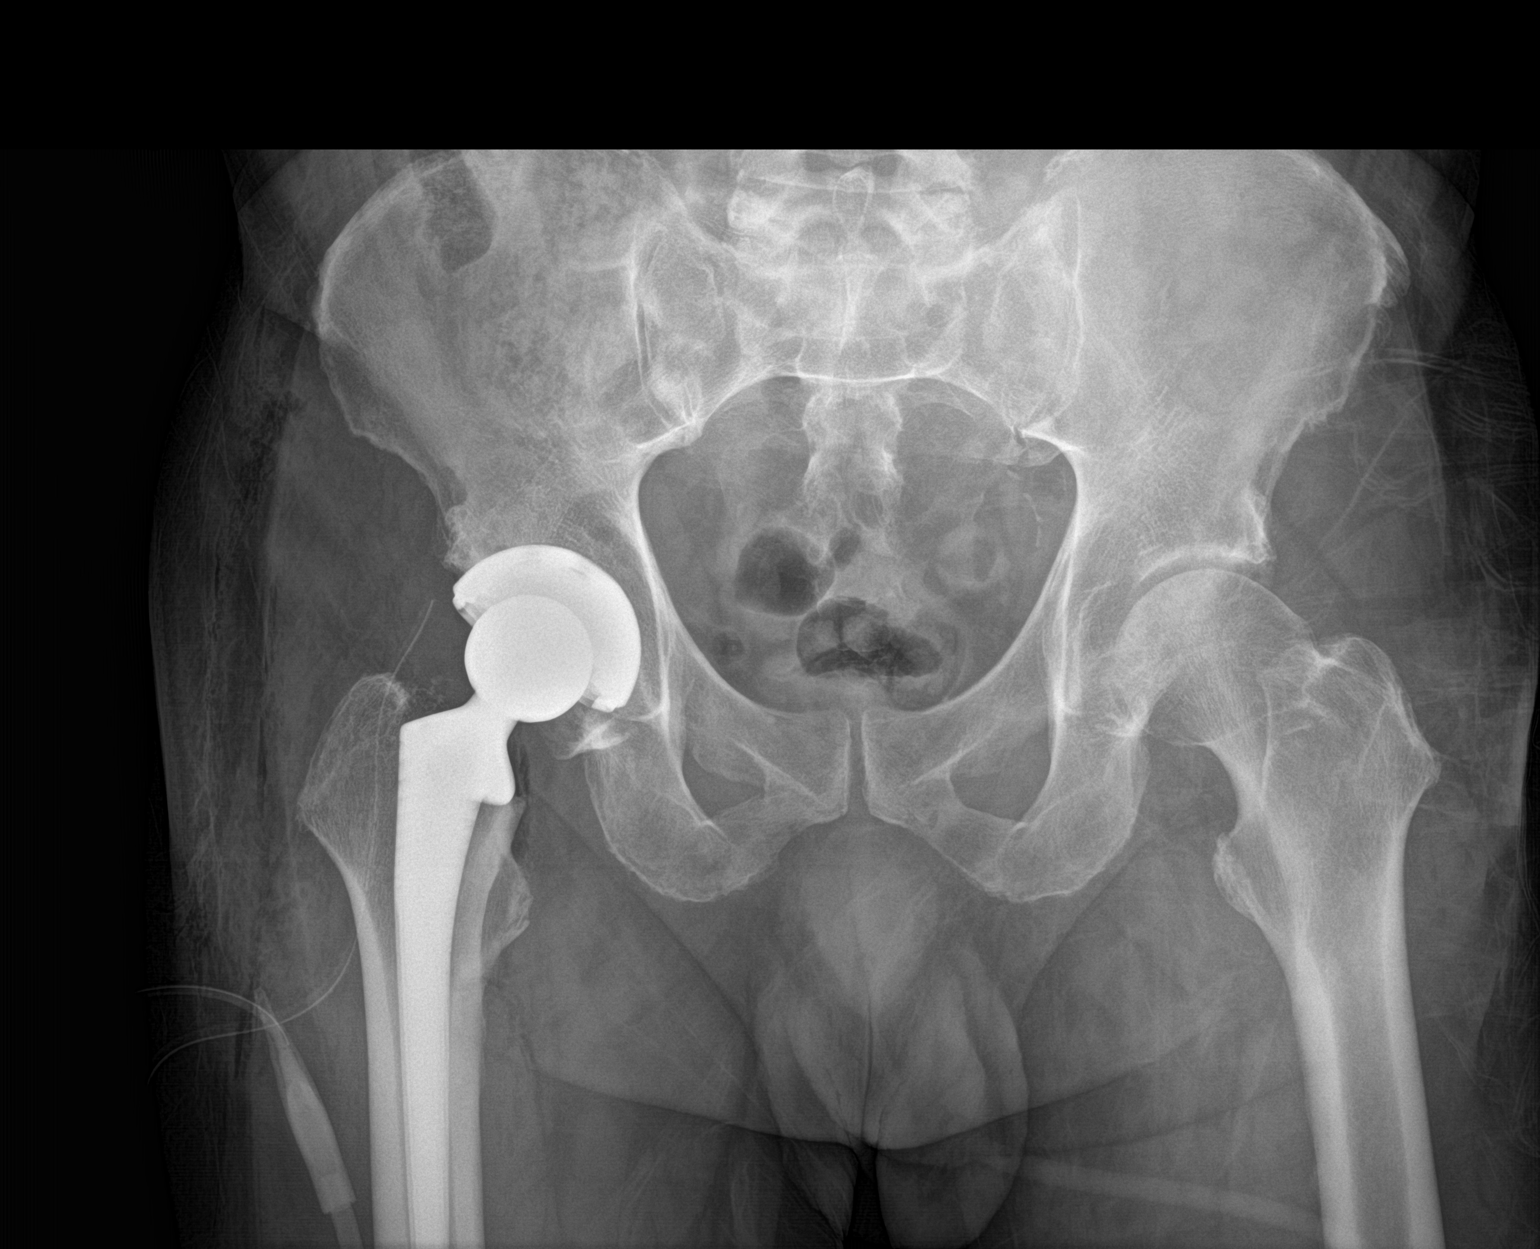
[im 2/2]
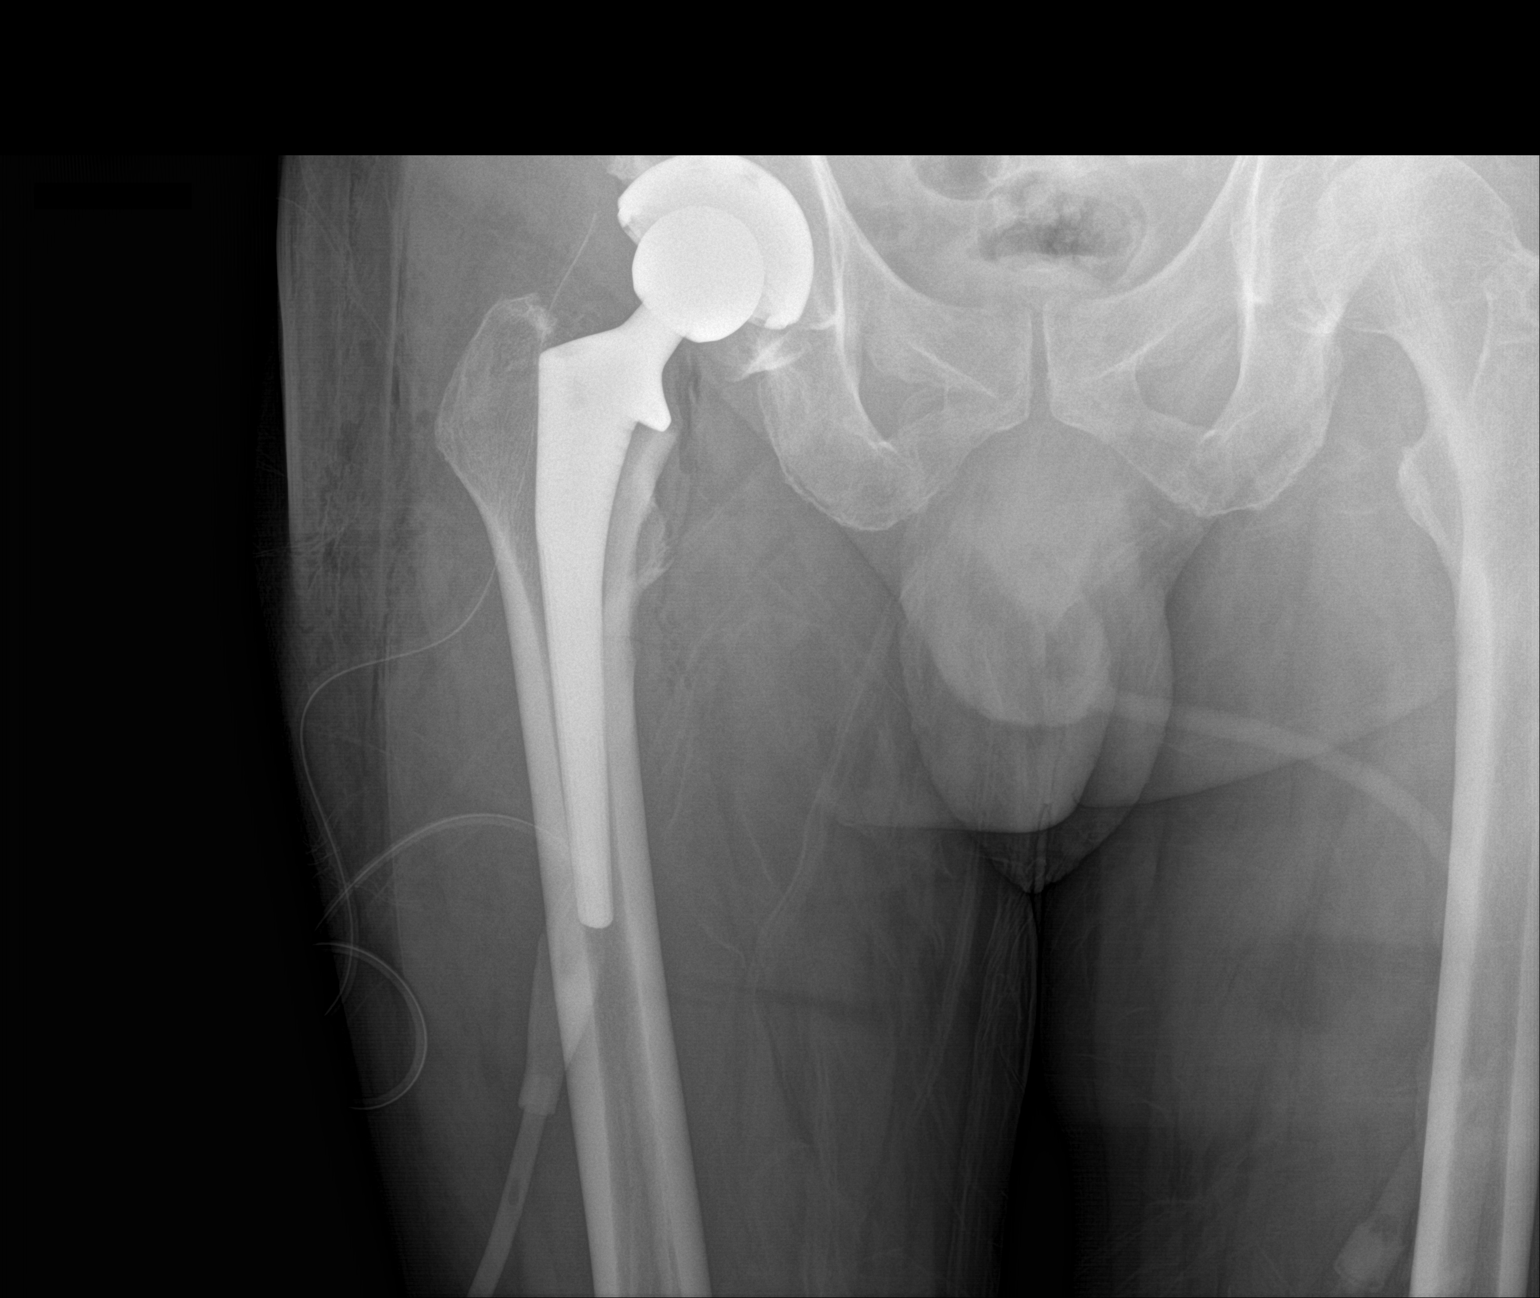

[2 of 2 positions shown; findings below may reference images not displayed]

FINDINGS: New right total hip arthroplasty is in place. The device is located.
No fracture is identified. Gas the soft tissues and surgical drain
are noted. Left hip osteoarthritis is seen.
IMPRESSION: Status post right total hip replacement.  No acute abnormality.

## 2018-10-14 MED FILL — ROSUVASTATIN CALCIUM 10 MG: 10 | 90 days supply | Qty: 90 | Fill #0

## 2018-10-14 MED FILL — AMLODIPINE BESYLATE 10 MG T: 10 | 90 days supply | Qty: 90 | Fill #0

## 2018-10-14 MED FILL — METFORMIN HCL ER 500 MG TB2: 500 | 60 days supply | Qty: 180 | Fill #0

## 2018-10-14 MED FILL — IRBESARTAN 300 MG TABLET: 300 | 30 days supply | Qty: 30 | Fill #0

## 2018-12-08 MED FILL — IRBESARTAN 300 MG TABS: 300 | 30 days supply | Qty: 30 | Fill #1

## 2019-01-07 MED FILL — ROSUVASTATIN CALCIUM 10 MG: 10 | 90 days supply | Qty: 90 | Fill #0

## 2019-01-07 MED FILL — AMLODIPINE BESYLATE 10 MG T: 10 | 90 days supply | Qty: 90 | Fill #0

## 2019-01-07 MED FILL — metFORMIN HCL ER 500 MG TB2: 500 | 60 days supply | Qty: 180 | Fill #0

## 2019-01-07 MED FILL — IRBESARTAN 300 MG TABS: 300 | 30 days supply | Qty: 30 | Fill #0

## 2019-02-08 MED FILL — IRBESARTAN 300 MG TABS: 300 | 30 days supply | Qty: 30 | Fill #1

## 2019-03-17 MED FILL — IRBESARTAN 300 MG TABS: 300 | 30 days supply | Qty: 30 | Fill #2

## 2019-04-11 MED FILL — AMLODIPINE BESYLATE 10 MG T: 10 | 90 days supply | Qty: 90 | Fill #0

## 2019-04-11 MED FILL — SILDENAFIL CITRATE 50 MG TA: 50 | 10 days supply | Qty: 10 | Fill #0

## 2019-04-11 MED FILL — IRBESARTAN 300 MG TABS: 300 | 90 days supply | Qty: 90 | Fill #0

## 2019-04-11 MED FILL — metFORMIN HCL ER 500 MG TB2: 500 | 60 days supply | Qty: 180 | Fill #0

## 2019-05-25 ENCOUNTER — Ambulatory Visit: Payer: Medicare Other

## 2019-06-03 ENCOUNTER — Ambulatory Visit: Payer: Medicare Other | Attending: Internal Medicine

## 2019-06-03 DIAGNOSIS — Z23 Encounter for immunization: Secondary | ICD-10-CM | POA: Insufficient documentation

## 2019-06-03 NOTE — Progress Notes (Signed)
   Covid-19 Vaccination Clinic  Name:  Jay Porter    MRN: [id number] DOB: 1946-04-24  06/03/2019  [name] was observed post Covid-19 immunization for 15 minutes without incidence. He was provided with Vaccine Information Sheet and instruction to access the V-Safe system.   [name] was instructed to call 911 with any severe reactions post vaccine: Marland Kitchen Difficulty breathing  . Swelling of your face and throat  . A fast heartbeat  . A bad rash all over your body  . Dizziness and weakness    Immunizations Administered    Name Date Dose VIS Date Route   Pfizer COVID-19 Vaccine 06/03/2019  8:53 AM 0.3 mL 04/08/2019 Intramuscular   Manufacturer: [place]   Lot: [id number]   [place]: [id number]

## 2019-06-28 ENCOUNTER — Ambulatory Visit: Payer: Medicare Other | Attending: Internal Medicine

## 2019-06-28 DIAGNOSIS — Z23 Encounter for immunization: Secondary | ICD-10-CM | POA: Insufficient documentation

## 2019-06-28 NOTE — Progress Notes (Signed)
   Covid-19 Vaccination Clinic  Name:  Jay Porter    MRN: [id number] DOB: 06/21/1945  06/28/2019  [name] was observed post Covid-19 immunization for 15 minutes without incident. He was provided with Vaccine Information Sheet and instruction to access the V-Safe system.   [name] was instructed to call 911 with any severe reactions post vaccine: Marland Kitchen Difficulty breathing  . Swelling of face and throat  . A fast heartbeat  . A bad rash all over body  . Dizziness and weakness   Immunizations Administered    Name Date Dose VIS Date Route   Pfizer COVID-19 Vaccine 06/28/2019  9:15 AM 0.3 mL 04/08/2019 Intramuscular   Manufacturer: [place]   Lot: [id number]   [place]: [id number]

## 2019-07-12 MED FILL — AMLODIPINE BESYLATE 10 MG T: 10 | 90 days supply | Qty: 90 | Fill #0

## 2019-07-12 MED FILL — IRBESARTAN 300 MG TABS: 300 | 90 days supply | Qty: 90 | Fill #0

## 2019-08-01 ENCOUNTER — Other Ambulatory Visit (HOSPITAL_COMMUNITY): Payer: Self-pay | Admitting: Internal Medicine

## 2019-08-01 MED FILL — ROSUVASTATIN CALCIUM 10 MG: 10 | 90 days supply | Qty: 90 | Fill #0

## 2019-08-01 MED FILL — AMLODIPINE BESYLATE 10 MG T: 10 | 90 days supply | Qty: 90 | Fill #1

## 2019-08-01 MED FILL — metFORMIN HCL ER 500 MG TB2: 500 | 60 days supply | Qty: 180 | Fill #1

## 2019-08-01 MED FILL — IRBESARTAN 300 MG TABS: 300 | 90 days supply | Qty: 90 | Fill #1

## 2019-08-25 MED FILL — LEVOTHYROXINE SODIUM 75 MCG: 75 | 30 days supply | Qty: 30 | Fill #0

## 2019-09-19 MED FILL — LEVOTHYROXINE SODIUM 75 MCG: 75 | 30 days supply | Qty: 30 | Fill #1

## 2019-09-19 MED FILL — SILDENAFIL CITRATE 50 MG TA: 50 | 10 days supply | Qty: 10 | Fill #1

## 2019-10-18 MED FILL — LEVOTHYROXINE SODIUM 75 MCG: 75 | 30 days supply | Qty: 30 | Fill #0

## 2019-11-14 ENCOUNTER — Other Ambulatory Visit (HOSPITAL_COMMUNITY): Payer: Self-pay | Admitting: Internal Medicine

## 2019-11-14 MED FILL — metFORMIN HCL ER 500 MG TB2: 500 | 60 days supply | Qty: 180 | Fill #0

## 2019-11-14 MED FILL — IRBESARTAN 300 MG TAB: 300 | 90 days supply | Qty: 90 | Fill #0

## 2019-11-14 MED FILL — LEVOTHYROXINE SODIUM 75 MCG: 75 | 90 days supply | Qty: 90 | Fill #0

## 2019-11-26 MED FILL — ROSUVASTATIN CALCIUM 10 MG: 10 | 90 days supply | Qty: 90 | Fill #1

## 2020-01-13 MED FILL — metFORMIN HCL ER 500 MG TB2: 500 | 60 days supply | Qty: 180 | Fill #1

## 2020-01-31 ENCOUNTER — Ambulatory Visit: Payer: Medicare Other | Attending: Internal Medicine

## 2020-01-31 DIAGNOSIS — Z23 Encounter for immunization: Secondary | ICD-10-CM

## 2020-01-31 NOTE — Progress Notes (Signed)
   Covid-19 Vaccination Clinic  Name:  WORTHY BOSCHERT    MRN: [id number] DOB: 02/03/1946  01/31/2020  Mr. Newbury was observed post Covid-19 immunization for 15 minutes without incident. He was provided with Vaccine Information Sheet and instruction to access the V-Safe system.   Mr. Rorke was instructed to call 911 with any severe reactions post vaccine: Marland Kitchen Difficulty breathing  . Swelling of face and throat  . A fast heartbeat  . A bad rash all over body  . Dizziness and weakness

## 2020-02-25 MED FILL — SILDENAFIL CITRATE 50 MG TA: 50 | 10 days supply | Qty: 10 | Fill #2

## 2020-02-25 MED FILL — IRBESARTAN 300 MG TABS: 300 | 90 days supply | Qty: 90 | Fill #1

## 2020-02-25 MED FILL — LEVOTHYROXINE SODIUM 75 MCG: 75 | 90 days supply | Qty: 90 | Fill #1

## 2020-02-27 ENCOUNTER — Other Ambulatory Visit (HOSPITAL_COMMUNITY): Payer: Self-pay | Admitting: Internal Medicine

## 2020-02-27 MED FILL — metFORMIN HCL ER 500 MG TB2: 500 | 60 days supply | Qty: 180 | Fill #0

## 2020-03-16 MED FILL — ROSUVASTATIN CALCIUM 10 MG: 10 | 90 days supply | Qty: 90 | Fill #2

## 2020-03-30 MED FILL — SILDENAFIL CITRATE 50 MG TA: 50 | 10 days supply | Qty: 10 | Fill #3

## 2020-05-16 ENCOUNTER — Other Ambulatory Visit (HOSPITAL_COMMUNITY): Payer: Self-pay | Admitting: Internal Medicine

## 2020-05-16 MED FILL — SILDENAFIL CITRATE 50 MG TA: 50 | 30 days supply | Qty: 10 | Fill #0

## 2020-05-16 MED FILL — metFORMIN HCL ER 500 MG TB2: 500 | 60 days supply | Qty: 180 | Fill #0

## 2020-05-16 MED FILL — IRBESARTAN 300 MG TABS: 300 | 90 days supply | Qty: 90 | Fill #0

## 2020-05-16 MED FILL — LEVOTHYROXINE SODIUM 75 MCG: 75 | 90 days supply | Qty: 90 | Fill #0

## 2020-05-16 MED FILL — AMLODIPINE BESYLATE 10 MG T: 10 | 90 days supply | Qty: 90 | Fill #0

## 2020-06-11 MED FILL — ROSUVASTATIN CALCIUM 10 MG: 10 | 90 days supply | Qty: 90 | Fill #0

## 2020-06-27 ENCOUNTER — Other Ambulatory Visit (HOSPITAL_COMMUNITY): Payer: Self-pay | Admitting: Internal Medicine

## 2020-06-28 ENCOUNTER — Other Ambulatory Visit (HOSPITAL_COMMUNITY): Payer: Self-pay | Admitting: Internal Medicine

## 2020-06-30 MED FILL — metFORMIN HCL ER 500 MG TB2: 500 | 60 days supply | Qty: 180 | Fill #0

## 2020-07-24 MED FILL — metFORMIN HCL ER 500 MG TB2: 500 | 60 days supply | Qty: 180 | Fill #0

## 2020-07-26 ENCOUNTER — Other Ambulatory Visit (HOSPITAL_COMMUNITY): Payer: Self-pay | Admitting: Internal Medicine

## 2020-07-28 ENCOUNTER — Other Ambulatory Visit (HOSPITAL_COMMUNITY): Payer: Self-pay

## 2020-07-28 MED FILL — Metformin HCl Tab ER 24HR 500 MG: ORAL | 60 days supply | Qty: 180 | Fill #0 | Status: CN

## 2020-08-06 ENCOUNTER — Ambulatory Visit: Payer: Medicare Other | Attending: Internal Medicine

## 2020-08-06 ENCOUNTER — Other Ambulatory Visit: Payer: Self-pay

## 2020-08-06 DIAGNOSIS — Z23 Encounter for immunization: Secondary | ICD-10-CM

## 2020-08-06 NOTE — Progress Notes (Signed)
   Covid-19 Vaccination Clinic  Name:  DANTRELL SCHERTZER    MRN: [id number] DOB: 12/02/1945  08/06/2020  Mr. Effertz was observed post Covid-19 immunization for 15 minutes without incident. He was provided with Vaccine Information Sheet and instruction to access the V-Safe system.   Mr. Ghosh was instructed to call 911 with any severe reactions post vaccine: Marland Kitchen Difficulty breathing  . Swelling of face and throat  . A fast heartbeat  . A bad rash all over body  . Dizziness and weakness   Immunizations Administered    Name Date Dose VIS Date Route   PFIZER Comrnaty(Gray TOP) Covid-19 Vaccine 08/06/2020  9:24 AM 0.3 mL 04/05/2020 Intramuscular   Manufacturer: [place]   Lot: [id number]   [place]: [phone]

## 2020-08-13 ENCOUNTER — Other Ambulatory Visit (HOSPITAL_COMMUNITY): Payer: Self-pay

## 2020-08-13 MED FILL — Rosuvastatin Calcium Tab 10 MG: ORAL | 90 days supply | Qty: 90 | Fill #0 | Status: CN

## 2020-08-13 MED FILL — Sildenafil Citrate Tab 50 MG: ORAL | 30 days supply | Qty: 10 | Fill #0 | Status: AC

## 2020-08-13 MED FILL — Irbesartan Tab 300 MG: ORAL | 90 days supply | Qty: 90 | Fill #0 | Status: CN

## 2020-08-13 MED FILL — Irbesartan Tab 300 MG: ORAL | 90 days supply | Qty: 90 | Fill #0 | Status: AC

## 2020-08-14 ENCOUNTER — Other Ambulatory Visit (HOSPITAL_COMMUNITY): Payer: Self-pay

## 2020-08-14 MED ORDER — LEVOTHYROXINE SODIUM 75 MCG PO TABS
ORAL_TABLET | ORAL | 3 refills | Status: DC
  Filled 2020-08-14: qty 90, 90d supply, fill #0
  Filled 2020-11-17: qty 90, 90d supply, fill #1
  Filled 2021-02-25: qty 90, 90d supply, fill #2
  Filled 2021-05-20: qty 90, 90d supply, fill #3

## 2020-08-14 MED ORDER — IRBESARTAN 300 MG PO TABS
300.0000 mg | ORAL_TABLET | Freq: Every day | ORAL | 0 refills | Status: DC
  Filled 2020-08-14: qty 90, 90d supply, fill #0

## 2020-08-15 ENCOUNTER — Other Ambulatory Visit (HOSPITAL_COMMUNITY): Payer: Self-pay

## 2020-08-18 ENCOUNTER — Other Ambulatory Visit (HOSPITAL_COMMUNITY): Payer: Self-pay

## 2020-08-18 MED FILL — Rosuvastatin Calcium Tab 10 MG: ORAL | 90 days supply | Qty: 90 | Fill #0 | Status: AC

## 2020-08-23 ENCOUNTER — Other Ambulatory Visit (HOSPITAL_COMMUNITY): Payer: Self-pay

## 2020-09-20 ENCOUNTER — Other Ambulatory Visit (HOSPITAL_COMMUNITY): Payer: Self-pay

## 2020-09-20 MED FILL — Amlodipine Besylate Tab 10 MG (Base Equivalent): ORAL | 90 days supply | Qty: 90 | Fill #0 | Status: AC

## 2020-09-26 ENCOUNTER — Other Ambulatory Visit (HOSPITAL_COMMUNITY): Payer: Self-pay

## 2020-09-26 MED ORDER — SILDENAFIL CITRATE 50 MG PO TABS
ORAL_TABLET | ORAL | 3 refills | Status: DC
  Filled 2020-09-26: qty 10, 75d supply, fill #0
  Filled 2021-03-11: qty 10, 75d supply, fill #1
  Filled 2021-05-30: qty 10, 75d supply, fill #2
  Filled 2021-09-19: qty 10, 75d supply, fill #3

## 2020-09-26 MED ORDER — IRBESARTAN 300 MG PO TABS
300.0000 mg | ORAL_TABLET | Freq: Every day | ORAL | 0 refills | Status: DC
  Filled 2020-09-26: qty 90, 90d supply, fill #0

## 2020-09-26 MED ORDER — METFORMIN HCL ER 500 MG PO TB24
1000.0000 mg | ORAL_TABLET | Freq: Every day | ORAL | 0 refills | Status: DC
  Filled 2020-09-26: qty 180, 90d supply, fill #0

## 2020-09-26 MED ORDER — AMLODIPINE BESYLATE 10 MG PO TABS
1.0000 | ORAL_TABLET | Freq: Every day | ORAL | 0 refills | Status: DC
  Filled 2020-09-26 – 2021-01-01 (×2): qty 90, 90d supply, fill #0

## 2020-11-17 ENCOUNTER — Other Ambulatory Visit (HOSPITAL_COMMUNITY): Payer: Self-pay

## 2020-11-17 MED FILL — Rosuvastatin Calcium Tab 10 MG: ORAL | 90 days supply | Qty: 90 | Fill #1 | Status: AC

## 2020-11-19 ENCOUNTER — Other Ambulatory Visit (HOSPITAL_COMMUNITY): Payer: Self-pay

## 2020-11-19 MED ORDER — IRBESARTAN 300 MG PO TABS
300.0000 mg | ORAL_TABLET | Freq: Every day | ORAL | 0 refills | Status: DC
  Filled 2020-11-19: qty 90, 90d supply, fill #0

## 2021-01-01 ENCOUNTER — Other Ambulatory Visit (HOSPITAL_COMMUNITY): Payer: Self-pay

## 2021-01-03 ENCOUNTER — Other Ambulatory Visit (HOSPITAL_COMMUNITY): Payer: Self-pay

## 2021-01-03 MED FILL — Metformin HCl Tab ER 24HR 500 MG: ORAL | 90 days supply | Qty: 180 | Fill #0 | Status: AC

## 2021-01-24 ENCOUNTER — Other Ambulatory Visit (HOSPITAL_COMMUNITY): Payer: Self-pay

## 2021-01-24 MED ORDER — PANTOPRAZOLE SODIUM 40 MG PO TBEC
DELAYED_RELEASE_TABLET | ORAL | 1 refills | Status: AC
  Filled 2021-01-24: qty 30, 30d supply, fill #0
  Filled 2021-11-22: qty 30, 30d supply, fill #1

## 2021-01-24 MED ORDER — AMLODIPINE BESYLATE 10 MG PO TABS
ORAL_TABLET | ORAL | 0 refills | Status: DC
  Filled 2021-01-24 – 2021-04-08 (×3): qty 90, 90d supply, fill #0

## 2021-01-24 MED ORDER — IRBESARTAN 300 MG PO TABS
300.0000 mg | ORAL_TABLET | Freq: Every day | ORAL | 0 refills | Status: DC
  Filled 2021-02-26: qty 90, 90d supply, fill #0

## 2021-01-24 MED ORDER — METFORMIN HCL ER 500 MG PO TB24
1000.0000 mg | ORAL_TABLET | Freq: Every day | ORAL | 0 refills | Status: DC
  Filled 2021-08-17: qty 180, 90d supply, fill #0

## 2021-01-30 ENCOUNTER — Ambulatory Visit: Payer: Medicare Other | Attending: Internal Medicine

## 2021-01-30 ENCOUNTER — Ambulatory Visit: Payer: Self-pay

## 2021-01-30 DIAGNOSIS — Z23 Encounter for immunization: Secondary | ICD-10-CM

## 2021-01-30 NOTE — Progress Notes (Signed)
   Covid-19 Vaccination Clinic  Name:  Jay Porter    MRN: [id number] DOB: 09/25/1945  01/30/2021  [name] was observed post Covid-19 immunization for 15 minutes without incident. He was provided with Vaccine Information Sheet and instruction to access the V-Safe system.   [name] was instructed to call 911 with any severe reactions post vaccine: Difficulty breathing  Swelling of face and throat  A fast heartbeat  A bad rash all over body  Dizziness and weakness

## 2021-02-08 ENCOUNTER — Other Ambulatory Visit (HOSPITAL_BASED_OUTPATIENT_CLINIC_OR_DEPARTMENT_OTHER): Payer: Self-pay

## 2021-02-08 MED ORDER — COVID-19MRNA BIVAL VACC PFIZER 30 MCG/0.3ML IM SUSP
INTRAMUSCULAR | 0 refills | Status: AC
  Filled 2021-02-08: qty 0.3, 1d supply, fill #0

## 2021-02-25 ENCOUNTER — Other Ambulatory Visit (HOSPITAL_COMMUNITY): Payer: Self-pay

## 2021-02-25 MED FILL — Rosuvastatin Calcium Tab 10 MG: ORAL | 90 days supply | Qty: 90 | Fill #2 | Status: AC

## 2021-02-26 ENCOUNTER — Other Ambulatory Visit (HOSPITAL_COMMUNITY): Payer: Self-pay

## 2021-02-27 ENCOUNTER — Other Ambulatory Visit (HOSPITAL_COMMUNITY): Payer: Self-pay

## 2021-02-27 MED ORDER — IRBESARTAN 300 MG PO TABS
300.0000 mg | ORAL_TABLET | Freq: Every day | ORAL | 0 refills | Status: DC
  Filled 2021-02-27 – 2021-08-17 (×2): qty 90, 90d supply, fill #0

## 2021-02-28 ENCOUNTER — Other Ambulatory Visit (HOSPITAL_BASED_OUTPATIENT_CLINIC_OR_DEPARTMENT_OTHER): Payer: Self-pay

## 2021-03-11 ENCOUNTER — Other Ambulatory Visit (HOSPITAL_COMMUNITY): Payer: Self-pay

## 2021-04-08 ENCOUNTER — Other Ambulatory Visit (HOSPITAL_COMMUNITY): Payer: Self-pay

## 2021-05-20 ENCOUNTER — Other Ambulatory Visit (HOSPITAL_COMMUNITY): Payer: Self-pay

## 2021-05-20 MED FILL — Rosuvastatin Calcium Tab 10 MG: ORAL | 90 days supply | Qty: 90 | Fill #3 | Status: AC

## 2021-05-21 ENCOUNTER — Other Ambulatory Visit (HOSPITAL_COMMUNITY): Payer: Self-pay

## 2021-05-21 MED ORDER — IRBESARTAN 300 MG PO TABS
300.0000 mg | ORAL_TABLET | Freq: Every day | ORAL | 0 refills | Status: DC
  Filled 2021-05-21: qty 90, 90d supply, fill #0

## 2021-05-21 MED ORDER — METFORMIN HCL ER 500 MG PO TB24
1000.0000 mg | ORAL_TABLET | Freq: Every day | ORAL | 0 refills | Status: DC
  Filled 2021-05-21: qty 180, 90d supply, fill #0

## 2021-05-22 ENCOUNTER — Other Ambulatory Visit (HOSPITAL_COMMUNITY): Payer: Self-pay

## 2021-05-22 MED ORDER — AZITHROMYCIN 500 MG PO TABS
ORAL_TABLET | ORAL | 0 refills | Status: DC
  Filled 2021-05-22: qty 4, 3d supply, fill #0

## 2021-05-30 ENCOUNTER — Other Ambulatory Visit (HOSPITAL_COMMUNITY): Payer: Self-pay

## 2021-06-06 ENCOUNTER — Other Ambulatory Visit (HOSPITAL_COMMUNITY): Payer: Self-pay

## 2021-06-06 MED ORDER — PAXLOVID (300/100) 20 X 150 MG & 10 X 100MG PO TBPK
ORAL_TABLET | ORAL | 0 refills | Status: DC
  Filled 2021-06-06: qty 30, 5d supply, fill #0

## 2021-06-07 ENCOUNTER — Other Ambulatory Visit (HOSPITAL_COMMUNITY): Payer: Self-pay

## 2021-07-16 ENCOUNTER — Other Ambulatory Visit (HOSPITAL_COMMUNITY): Payer: Self-pay

## 2021-07-16 MED ORDER — AMLODIPINE BESYLATE 10 MG PO TABS
10.0000 mg | ORAL_TABLET | Freq: Every day | ORAL | 0 refills | Status: DC
  Filled 2021-07-16: qty 90, 90d supply, fill #0

## 2021-08-17 ENCOUNTER — Other Ambulatory Visit (HOSPITAL_COMMUNITY): Payer: Self-pay

## 2021-08-19 ENCOUNTER — Other Ambulatory Visit (HOSPITAL_COMMUNITY): Payer: Self-pay

## 2021-08-19 MED ORDER — METFORMIN HCL ER 500 MG PO TB24
1000.0000 mg | ORAL_TABLET | Freq: Every day | ORAL | 0 refills | Status: DC
  Filled 2021-08-19: qty 180, 90d supply, fill #0

## 2021-08-19 MED ORDER — LEVOTHYROXINE SODIUM 75 MCG PO TABS
ORAL_TABLET | ORAL | 0 refills | Status: DC
  Filled 2021-08-19: qty 90, 90d supply, fill #0

## 2021-08-19 MED ORDER — ROSUVASTATIN CALCIUM 10 MG PO TABS
10.0000 mg | ORAL_TABLET | Freq: Every day | ORAL | 0 refills | Status: DC
  Filled 2021-08-19: qty 90, 90d supply, fill #0

## 2021-08-21 ENCOUNTER — Other Ambulatory Visit (HOSPITAL_COMMUNITY): Payer: Self-pay

## 2021-09-03 ENCOUNTER — Other Ambulatory Visit (HOSPITAL_COMMUNITY): Payer: Self-pay

## 2021-09-03 MED ORDER — ROSUVASTATIN CALCIUM 10 MG PO TABS
10.0000 mg | ORAL_TABLET | Freq: Every day | ORAL | 3 refills | Status: AC
  Filled 2021-09-03: qty 90, 90d supply, fill #0

## 2021-09-03 MED ORDER — IRBESARTAN 300 MG PO TABS
300.0000 mg | ORAL_TABLET | Freq: Every day | ORAL | 0 refills | Status: DC
  Filled 2021-11-22: qty 90, 90d supply, fill #0

## 2021-09-03 MED ORDER — METFORMIN HCL ER 500 MG PO TB24
1000.0000 mg | ORAL_TABLET | Freq: Every day | ORAL | 0 refills | Status: DC
  Filled 2021-11-22: qty 180, 90d supply, fill #0

## 2021-09-03 MED ORDER — AMLODIPINE BESYLATE 10 MG PO TABS
10.0000 mg | ORAL_TABLET | Freq: Every day | ORAL | 0 refills | Status: DC
  Filled 2021-09-03: qty 90, 90d supply, fill #0
  Filled 2021-10-04: qty 60, 60d supply, fill #0
  Filled 2021-11-22: qty 30, 30d supply, fill #1

## 2021-09-18 ENCOUNTER — Ambulatory Visit: Payer: Medicare Other | Attending: Internal Medicine

## 2021-09-18 DIAGNOSIS — Z23 Encounter for immunization: Secondary | ICD-10-CM

## 2021-09-19 ENCOUNTER — Other Ambulatory Visit (HOSPITAL_COMMUNITY): Payer: Self-pay

## 2021-09-20 ENCOUNTER — Other Ambulatory Visit (HOSPITAL_BASED_OUTPATIENT_CLINIC_OR_DEPARTMENT_OTHER): Payer: Self-pay

## 2021-09-20 MED ORDER — PFIZER COVID-19 VAC BIVALENT 30 MCG/0.3ML IM SUSP
INTRAMUSCULAR | 0 refills | Status: AC
Start: 2021-09-20 — End: ?
  Filled 2021-09-20: qty 0.3, 1d supply, fill #0

## 2021-09-20 NOTE — Progress Notes (Signed)
   Covid-19 Vaccination Clinic  Name:  Jay Porter    MRN: [id number] DOB: [birth year]  09/20/2021  [name] was observed post Covid-19 immunization for 15 minutes without incident. He was provided with Vaccine Information Sheet and instruction to access the V-Safe system.   [name] was instructed to call 911 with any severe reactions post vaccine: Difficulty breathing  Swelling of face and throat  A fast heartbeat  A bad rash all over body  Dizziness and weakness   Immunizations Administered     Name Date Dose VIS Date Route   Pfizer Covid-19 Vaccine Bivalent Booster 09/18/2021  5:34 PM 0.3 mL 12/26/2020 Intramuscular   Manufacturer: [place]   Lot: [id number]   [place]: [phone]

## 2021-10-04 ENCOUNTER — Other Ambulatory Visit (HOSPITAL_COMMUNITY): Payer: Self-pay

## 2021-11-22 ENCOUNTER — Other Ambulatory Visit (HOSPITAL_COMMUNITY): Payer: Self-pay

## 2021-11-23 ENCOUNTER — Other Ambulatory Visit (HOSPITAL_COMMUNITY): Payer: Self-pay

## 2021-11-25 ENCOUNTER — Other Ambulatory Visit (HOSPITAL_COMMUNITY): Payer: Self-pay

## 2021-11-26 ENCOUNTER — Other Ambulatory Visit (HOSPITAL_COMMUNITY): Payer: Self-pay

## 2021-11-26 MED ORDER — ROSUVASTATIN CALCIUM 10 MG PO TABS
10.0000 mg | ORAL_TABLET | Freq: Every day | ORAL | 3 refills | Status: DC
  Filled 2021-11-26: qty 90, 90d supply, fill #0
  Filled 2022-02-16: qty 90, 90d supply, fill #1
  Filled 2022-06-07: qty 90, 90d supply, fill #2
  Filled 2022-08-12: qty 90, 90d supply, fill #3

## 2021-11-28 ENCOUNTER — Other Ambulatory Visit (HOSPITAL_COMMUNITY): Payer: Self-pay

## 2021-11-28 MED ORDER — LEVOTHYROXINE SODIUM 75 MCG PO TABS
ORAL_TABLET | ORAL | 0 refills | Status: DC
  Filled 2021-11-28: qty 90, 90d supply, fill #0

## 2021-11-28 MED ORDER — AMLODIPINE BESYLATE 10 MG PO TABS
ORAL_TABLET | ORAL | 0 refills | Status: DC
  Filled 2021-11-28: qty 90, 90d supply, fill #0

## 2021-12-02 ENCOUNTER — Other Ambulatory Visit (HOSPITAL_COMMUNITY): Payer: Self-pay

## 2021-12-02 MED ORDER — AMLODIPINE BESYLATE 10 MG PO TABS
10.0000 mg | ORAL_TABLET | Freq: Every day | ORAL | 0 refills | Status: DC
  Filled 2021-12-02 – 2022-05-12 (×2): qty 90, 90d supply, fill #0

## 2021-12-02 MED ORDER — LEVOTHYROXINE SODIUM 75 MCG PO TABS
75.0000 ug | ORAL_TABLET | Freq: Every morning | ORAL | 0 refills | Status: DC
  Filled 2022-02-16: qty 90, 90d supply, fill #0

## 2021-12-04 ENCOUNTER — Other Ambulatory Visit (HOSPITAL_COMMUNITY): Payer: Self-pay

## 2021-12-06 ENCOUNTER — Other Ambulatory Visit (HOSPITAL_COMMUNITY): Payer: Self-pay

## 2021-12-20 ENCOUNTER — Ambulatory Visit: Payer: Self-pay | Admitting: General Surgery

## 2021-12-20 ENCOUNTER — Encounter (HOSPITAL_COMMUNITY): Payer: Self-pay | Admitting: General Surgery

## 2021-12-20 ENCOUNTER — Other Ambulatory Visit: Payer: Self-pay

## 2021-12-20 NOTE — H&P (Signed)
Chief Complaint: New Patient       History of Present Illness: Jay Porter is a 76 y.o. male who is seen today as an office consultation at the request of Dr. Lunette Stands for evaluation of New Patient .   Patient is a 76 year old male who comes in secondary to a ventral hernia.  He states it has been there for approximate 2 years.  He states that he has had no signs or symptoms of incarceration or strangulation.  He states he noticed that the area became sensitive after doing core strengthening exercises.   He had no previous abdominal surgery.           Review of Systems: A complete review of systems was obtained from the patient.  I have reviewed this information and discussed as appropriate with the patient.  See HPI as well for other ROS.   Review of Systems Constitutional: Negative for fever. HENT: Negative for congestion.   Eyes: Negative for blurred vision. Respiratory: Negative for cough, shortness of breath and wheezing.   Cardiovascular: Negative for chest pain and palpitations. Gastrointestinal: Negative for heartburn. Genitourinary: Negative for dysuria. Musculoskeletal: Negative for myalgias. Skin: Negative for rash. Neurological: Negative for dizziness and headaches. Psychiatric/Behavioral: Negative for depression and suicidal ideas.  All other systems reviewed and are negative.       Medical History: Past Medical History Past Medical History: Diagnosis Date  Diabetes mellitus without complication (CMS-HCC)        There is no problem list on file for this patient.     Past Surgical History Past Surgical History: Procedure Laterality Date  Right Total Hip Arthroplasty Anterior Approach   12/31/2016   Dr. Wanda Plump. [name]  Urethral Biopsy   06/16/2017   Dr. Jerilynn Mages. [place]      Allergies Allergies Allergen Reactions  Ace Inhibitors Other (See Comments)  Atorvastatin Other (See Comments)  Rosuvastatin Other (See Comments)     Aches on crestor 20, but  tolerates crestor 10      Current Outpatient Medications on File Prior to Visit Medication Sig Dispense Refill  amLODIPine (NORVASC) 10 MG tablet Take 1 tablet by mouth once daily      irbesartan (AVAPRO) 300 MG tablet Take 1 tablet by mouth once daily      levothyroxine (SYNTHROID) 75 MCG tablet Take 1 tablet (75 mcg total) by mouth Daily at 6 A.M.      metFORMIN (GLUCOPHAGE-XR) 500 MG XR tablet TAKE 2 TABS DAILY, IF SUGARS HIGH THEN TRANSITION TO 3 PILLS DAILY AT ONE TIME.      pantoprazole (PROTONIX) 40 MG DR tablet Take 1 tablet by mouth once daily      rosuvastatin (CRESTOR) 10 MG tablet Take 1 tablet by mouth once daily      sildenafiL (VIAGRA) 50 MG tablet Take 1 tablet by mouth 1/2- 4 hrs prior to intercourse.  Max 1 dose a day. If erection lasts greater than 4 hours seek medical attention immediately. Contraindicated with all nitrates.       No current facility-administered medications on file prior to visit.     Family History History reviewed. No pertinent family history.     Social History   Tobacco Use Smoking Status Former  Types: Cigarettes  Quit date: 1970  Years since quitting: 53.4 Smokeless Tobacco Never     Social History Social History    Socioeconomic History  Marital status: Married Tobacco Use  Smoking status: Former     Types: Cigarettes  Quit date: 50     Years since quitting: 53.4  Smokeless tobacco: Never Substance and Sexual Activity  Alcohol use: Yes  Drug use: Never      Objective:     Vitals:   09/24/21 1419 BP: 120/80 Pulse: 87 Temp: 36.5 C (97.7 F) SpO2: 98% Weight: 78.3 kg (172 lb 9.6 oz) Height: 167.6 cm ('5\' 6"'$ )   Body mass index is 27.86 kg/m.   Physical Exam Constitutional:      General: He is not in acute distress.    Appearance: Normal appearance.  HENT:     Head: Normocephalic.     Nose: No rhinorrhea.     Mouth/Throat:     Mouth: Mucous membranes are moist.     Pharynx: Oropharynx is clear.   Eyes:     General: No scleral icterus.    Pupils: Pupils are equal, round, and reactive to light.  Cardiovascular:     Rate and Rhythm: Normal rate.     Pulses: Normal pulses.  Pulmonary:     Effort: Pulmonary effort is normal. No respiratory distress.     Breath sounds: No stridor. No wheezing.  Abdominal:     General: Abdomen is flat. There is no distension.     Tenderness: There is no abdominal tenderness. There is no guarding or rebound.     Hernia: A hernia is present. Hernia is present in the ventral area.    Musculoskeletal:        General: Normal range of motion.     Cervical back: Normal range of motion and neck supple.  Skin:    General: Skin is warm and dry.     Capillary Refill: Capillary refill takes less than 2 seconds.     Coloration: Skin is not jaundiced.  Neurological:     General: No focal deficit present.     Mental Status: He is alert and oriented to person, place, and time. Mental status is at baseline.  Psychiatric:        Mood and Affect: Mood normal.        Thought Content: Thought content normal.        Judgment: Judgment normal.        Hernia Size:3cm Incarcerated: no Recurrent Hernia   Assessment and Plan: Diagnoses and all orders for this visit:   Ventral hernia without obstruction or gangrene     Jay Porter is a 76 y.o. male    1.  We will proceed to the OR for a lap ventral hernia repair with mesh. 2. All risks and benefits were discussed with the patient, to generally include infection, bleeding, damage to surrounding structures, acute and chronic nerve pain, and recurrence. Alternatives were offered and described.  All questions were answered and the patient voiced understanding of the procedure and wishes to proceed at this point

## 2021-12-20 NOTE — Progress Notes (Addendum)
COVID Vaccine Completed:  Yes  Date of COVID positive in last 90 days:  No  PCP - Azzie Roup, MD Cardiologist - N/A  Chest x-ray - N/A EKG - N/A, to do day of surgery  Stress Test - greater than 2 years for a physical ECHO - N/A Cardiac Cath - N/A Pacemaker/ICD device last checked: Spinal Cord Stimulator:N/A  Bowel Prep - N/A  Sleep Study - N/A CPAP -   Fasting Blood Sugar - 100 to 125 Checks Blood Sugar  - checks occasionally  Blood Thinner Instructions:  N/A Aspirin Instructions: Last Dose:  Activity level:   Can go up a flight of stairs and perform activities of daily living without stopping and without symptoms of chest pain or shortness of breath.  Able to exercise without symptoms  Anesthesia review:  N/A  Patient denies shortness of breath, fever, cough and chest pain at PAT appointment (completed over the phone)  Patient verbalized understanding of instructions that were given to them at the PAT appointment. Patient was also instructed that they will need to review over the PAT instructions again at home before surgery.

## 2021-12-20 NOTE — H&P (View-Only) (Signed)
Chief Complaint: New Patient       History of Present Illness: Jay Porter is a 76 y.o. male who is seen today as an office consultation at the request of Dr. Lunette Stands for evaluation of New Patient .   Patient is a 76 year old male who comes in secondary to a ventral hernia.  He states it has been there for approximate 2 years.  He states that he has had no signs or symptoms of incarceration or strangulation.  He states he noticed that the area became sensitive after doing core strengthening exercises.   He had no previous abdominal surgery.           Review of Systems: A complete review of systems was obtained from the patient.  I have reviewed this information and discussed as appropriate with the patient.  See HPI as well for other ROS.   Review of Systems Constitutional: Negative for fever. HENT: Negative for congestion.   Eyes: Negative for blurred vision. Respiratory: Negative for cough, shortness of breath and wheezing.   Cardiovascular: Negative for chest pain and palpitations. Gastrointestinal: Negative for heartburn. Genitourinary: Negative for dysuria. Musculoskeletal: Negative for myalgias. Skin: Negative for rash. Neurological: Negative for dizziness and headaches. Psychiatric/Behavioral: Negative for depression and suicidal ideas.  All other systems reviewed and are negative.       Medical History: Past Medical History Past Medical History: Diagnosis Date  Diabetes mellitus without complication (CMS-HCC)        There is no problem list on file for this patient.     Past Surgical History Past Surgical History: Procedure Laterality Date  Right Total Hip Arthroplasty Anterior Approach   12/31/2016   Dr. Wanda Plump. [name]  Urethral Biopsy   06/16/2017   Dr. Jerilynn Mages. [place]      Allergies Allergies Allergen Reactions  Ace Inhibitors Other (See Comments)  Atorvastatin Other (See Comments)  Rosuvastatin Other (See Comments)     Aches on crestor 20, but  tolerates crestor 10      Current Outpatient Medications on File Prior to Visit Medication Sig Dispense Refill  amLODIPine (NORVASC) 10 MG tablet Take 1 tablet by mouth once daily      irbesartan (AVAPRO) 300 MG tablet Take 1 tablet by mouth once daily      levothyroxine (SYNTHROID) 75 MCG tablet Take 1 tablet (75 mcg total) by mouth Daily at 6 A.M.      metFORMIN (GLUCOPHAGE-XR) 500 MG XR tablet TAKE 2 TABS DAILY, IF SUGARS HIGH THEN TRANSITION TO 3 PILLS DAILY AT ONE TIME.      pantoprazole (PROTONIX) 40 MG DR tablet Take 1 tablet by mouth once daily      rosuvastatin (CRESTOR) 10 MG tablet Take 1 tablet by mouth once daily      sildenafiL (VIAGRA) 50 MG tablet Take 1 tablet by mouth 1/2- 4 hrs prior to intercourse.  Max 1 dose a day. If erection lasts greater than 4 hours seek medical attention immediately. Contraindicated with all nitrates.       No current facility-administered medications on file prior to visit.     Family History History reviewed. No pertinent family history.     Social History   Tobacco Use Smoking Status Former  Types: Cigarettes  Quit date: 1970  Years since quitting: 53.4 Smokeless Tobacco Never     Social History Social History    Socioeconomic History  Marital status: Married Tobacco Use  Smoking status: Former     Types: Cigarettes  Quit date: 60     Years since quitting: 53.4  Smokeless tobacco: Never Substance and Sexual Activity  Alcohol use: Yes  Drug use: Never      Objective:     Vitals:   09/24/21 1419 BP: 120/80 Pulse: 87 Temp: 36.5 C (97.7 F) SpO2: 98% Weight: 78.3 kg (172 lb 9.6 oz) Height: 167.6 cm ('5\' 6"'$ )   Body mass index is 27.86 kg/m.   Physical Exam Constitutional:      General: He is not in acute distress.    Appearance: Normal appearance.  HENT:     Head: Normocephalic.     Nose: No rhinorrhea.     Mouth/Throat:     Mouth: Mucous membranes are moist.     Pharynx: Oropharynx is clear.   Eyes:     General: No scleral icterus.    Pupils: Pupils are equal, round, and reactive to light.  Cardiovascular:     Rate and Rhythm: Normal rate.     Pulses: Normal pulses.  Pulmonary:     Effort: Pulmonary effort is normal. No respiratory distress.     Breath sounds: No stridor. No wheezing.  Abdominal:     General: Abdomen is flat. There is no distension.     Tenderness: There is no abdominal tenderness. There is no guarding or rebound.     Hernia: A hernia is present. Hernia is present in the ventral area.    Musculoskeletal:        General: Normal range of motion.     Cervical back: Normal range of motion and neck supple.  Skin:    General: Skin is warm and dry.     Capillary Refill: Capillary refill takes less than 2 seconds.     Coloration: Skin is not jaundiced.  Neurological:     General: No focal deficit present.     Mental Status: He is alert and oriented to person, place, and time. Mental status is at baseline.  Psychiatric:        Mood and Affect: Mood normal.        Thought Content: Thought content normal.        Judgment: Judgment normal.        Hernia Size:3cm Incarcerated: no Recurrent Hernia   Assessment and Plan: Diagnoses and all orders for this visit:   Ventral hernia without obstruction or gangrene     Jay Porter is a 76 y.o. male    1.  We will proceed to the OR for a lap ventral hernia repair with mesh. 2. All risks and benefits were discussed with the patient, to generally include infection, bleeding, damage to surrounding structures, acute and chronic nerve pain, and recurrence. Alternatives were offered and described.  All questions were answered and the patient voiced understanding of the procedure and wishes to proceed at this point

## 2021-12-20 NOTE — Progress Notes (Signed)
Surgery orders requested via Epic inbox. °

## 2021-12-23 ENCOUNTER — Ambulatory Visit (HOSPITAL_BASED_OUTPATIENT_CLINIC_OR_DEPARTMENT_OTHER): Payer: Medicare Other | Admitting: Anesthesiology

## 2021-12-23 ENCOUNTER — Ambulatory Visit (HOSPITAL_COMMUNITY): Payer: Medicare Other | Admitting: Anesthesiology

## 2021-12-23 ENCOUNTER — Other Ambulatory Visit (HOSPITAL_COMMUNITY): Payer: Self-pay

## 2021-12-23 ENCOUNTER — Encounter (HOSPITAL_COMMUNITY)
Admission: RE | Disposition: A | Discharge status: Home / Self Care | Payer: Self-pay | Source: Ambulatory Visit | Attending: General Surgery

## 2021-12-23 ENCOUNTER — Ambulatory Visit (HOSPITAL_COMMUNITY)
Admission: RE | Admit: 2021-12-23 | Discharge: 2021-12-23 | Disposition: A | Discharge status: Home / Self Care | Payer: Medicare Other | Source: Ambulatory Visit | Attending: General Surgery | Admitting: General Surgery

## 2021-12-23 ENCOUNTER — Encounter (HOSPITAL_COMMUNITY): Payer: Self-pay | Admitting: General Surgery

## 2021-12-23 DIAGNOSIS — E119 Type 2 diabetes mellitus without complications: Secondary | ICD-10-CM

## 2021-12-23 DIAGNOSIS — Z87891 Personal history of nicotine dependence: Secondary | ICD-10-CM | POA: Insufficient documentation

## 2021-12-23 DIAGNOSIS — I2510 Atherosclerotic heart disease of native coronary artery without angina pectoris: Secondary | ICD-10-CM

## 2021-12-23 DIAGNOSIS — K760 Fatty (change of) liver, not elsewhere classified: Secondary | ICD-10-CM | POA: Insufficient documentation

## 2021-12-23 DIAGNOSIS — K439 Ventral hernia without obstruction or gangrene: Secondary | ICD-10-CM | POA: Diagnosis not present

## 2021-12-23 HISTORY — PX: DIAGNOSTIC LAPAROSCOPIC LIVER BIOPSY: SHX5797

## 2021-12-23 HISTORY — PX: VENTRAL HERNIA REPAIR: SHX424

## 2021-12-23 HISTORY — DX: Hypothyroidism, unspecified: E03.9

## 2021-12-23 LAB — BASIC METABOLIC PANEL
Anion gap: 10 (ref 5–15)
BUN: 15 mg/dL (ref 8–23)
CO2: 22 mmol/L (ref 22–32)
Calcium: 9.3 mg/dL (ref 8.9–10.3)
Chloride: 107 mmol/L (ref 98–111)
Creatinine, Ser: 1 mg/dL (ref 0.61–1.24)
GFR, Estimated: >60 mL/min (ref >60)
Glucose, Bld: 154 mg/dL — ABNORMAL HIGH (ref 70–99)
Potassium: 4.3 mmol/L (ref 3.5–5.1)
Sodium: 139 mmol/L (ref 135–145)

## 2021-12-23 LAB — GLUCOSE, CAPILLARY: Glucose-Capillary: 177 mg/dL — ABNORMAL HIGH (ref 70–99)

## 2021-12-23 SURGERY — REPAIR, HERNIA, VENTRAL, LAPAROSCOPIC
Anesthesia: General

## 2021-12-23 MED ORDER — ACETAMINOPHEN 500 MG PO TABS
1000.0000 mg | ORAL_TABLET | ORAL | Status: AC
  Administered 2021-12-23: 1000 mg via ORAL
  Filled 2021-12-23: qty 2

## 2021-12-23 MED ORDER — DEXAMETHASONE SODIUM PHOSPHATE 10 MG/ML IJ SOLN
INTRAMUSCULAR | Status: AC
  Filled 2021-12-23: qty 1

## 2021-12-23 MED ORDER — ROCURONIUM BROMIDE 10 MG/ML (PF) SYRINGE
PREFILLED_SYRINGE | INTRAVENOUS | Status: AC
  Filled 2021-12-23: qty 10

## 2021-12-23 MED ORDER — OXYCODONE HCL 5 MG PO TABS
5.0000 mg | ORAL_TABLET | Freq: Once | ORAL | Status: AC | PRN
  Administered 2021-12-23: 5 mg via ORAL

## 2021-12-23 MED ORDER — HYDROMORPHONE HCL 1 MG/ML IJ SOLN
INTRAMUSCULAR | Status: AC
  Filled 2021-12-23: qty 1

## 2021-12-23 MED ORDER — PROPOFOL 10 MG/ML IV BOLUS
INTRAVENOUS | Status: DC | PRN
  Administered 2021-12-23: 100 mg via INTRAVENOUS

## 2021-12-23 MED ORDER — BUPIVACAINE-EPINEPHRINE 0.25% -1:200000 IJ SOLN
INTRAMUSCULAR | Status: DC | PRN
  Administered 2021-12-23: 20 mL

## 2021-12-23 MED ORDER — CEFAZOLIN SODIUM-DEXTROSE 2-4 GM/100ML-% IV SOLN
2.0000 g | INTRAVENOUS | Status: AC
  Administered 2021-12-23: 2 g via INTRAVENOUS
  Filled 2021-12-23: qty 100

## 2021-12-23 MED ORDER — ORAL CARE MOUTH RINSE: 15.0000 mL | Freq: Once | OROMUCOSAL | Status: AC

## 2021-12-23 MED ORDER — CHLORHEXIDINE GLUCONATE 0.12 % MT SOLN
15.0000 mL | Freq: Once | OROMUCOSAL | Status: AC
  Administered 2021-12-23: 15 mL via OROMUCOSAL

## 2021-12-23 MED ORDER — ONDANSETRON HCL 4 MG/2ML IJ SOLN
INTRAMUSCULAR | Status: DC | PRN
  Administered 2021-12-23: 4 mg via INTRAVENOUS

## 2021-12-23 MED ORDER — FENTANYL CITRATE (PF) 100 MCG/2ML IJ SOLN
INTRAMUSCULAR | Status: AC
  Filled 2021-12-23: qty 2

## 2021-12-23 MED ORDER — DEXAMETHASONE SODIUM PHOSPHATE 10 MG/ML IJ SOLN
INTRAMUSCULAR | Status: DC | PRN
  Administered 2021-12-23: 4 mg via INTRAVENOUS

## 2021-12-23 MED ORDER — ONDANSETRON HCL 4 MG/2ML IJ SOLN
INTRAMUSCULAR | Status: AC
Start: 2021-12-23 — End: ?
  Filled 2021-12-23: qty 2

## 2021-12-23 MED ORDER — LIDOCAINE 2% (20 MG/ML) 5 ML SYRINGE
INTRAMUSCULAR | Status: DC | PRN
  Administered 2021-12-23: 60 mg via INTRAVENOUS

## 2021-12-23 MED ORDER — ACETAMINOPHEN 500 MG PO TABS: 1000.0000 mg | ORAL_TABLET | Freq: Once | ORAL | Status: DC

## 2021-12-23 MED ORDER — CHLORHEXIDINE GLUCONATE CLOTH 2 % EX PADS: 6.0000 | MEDICATED_PAD | Freq: Once | CUTANEOUS | Status: DC

## 2021-12-23 MED ORDER — ONDANSETRON HCL 4 MG/2ML IJ SOLN: 4.0000 mg | Freq: Once | INTRAMUSCULAR | Status: DC | PRN

## 2021-12-23 MED ORDER — BUPIVACAINE-EPINEPHRINE (PF) 0.25% -1:200000 IJ SOLN
INTRAMUSCULAR | Status: AC
  Filled 2021-12-23: qty 30

## 2021-12-23 MED ORDER — TRAMADOL HCL 50 MG PO TABS
50.0000 mg | ORAL_TABLET | Freq: Four times a day (QID) | ORAL | 0 refills | Status: AC | PRN
  Filled 2021-12-23: qty 20, 5d supply, fill #0

## 2021-12-23 MED ORDER — AMISULPRIDE (ANTIEMETIC) 5 MG/2ML IV SOLN
10.0000 mg | Freq: Once | INTRAVENOUS | Status: DC | PRN
Start: 2021-12-23 — End: 2021-12-23

## 2021-12-23 MED ORDER — PROPOFOL 10 MG/ML IV BOLUS
INTRAVENOUS | Status: AC
  Filled 2021-12-23: qty 20

## 2021-12-23 MED ORDER — PHENYLEPHRINE 80 MCG/ML (10ML) SYRINGE FOR IV PUSH (FOR BLOOD PRESSURE SUPPORT)
PREFILLED_SYRINGE | INTRAVENOUS | Status: DC | PRN
  Administered 2021-12-23: 80 ug via INTRAVENOUS

## 2021-12-23 MED ORDER — LIDOCAINE HCL (PF) 2 % IJ SOLN
INTRAMUSCULAR | Status: AC
  Filled 2021-12-23: qty 10

## 2021-12-23 MED ORDER — PHENYLEPHRINE 80 MCG/ML (10ML) SYRINGE FOR IV PUSH (FOR BLOOD PRESSURE SUPPORT)
PREFILLED_SYRINGE | INTRAVENOUS | Status: AC
  Filled 2021-12-23: qty 10

## 2021-12-23 MED ORDER — HYDROMORPHONE HCL 1 MG/ML IJ SOLN
0.2500 mg | INTRAMUSCULAR | Status: DC | PRN
  Administered 2021-12-23 (×2): 0.5 mg via INTRAVENOUS

## 2021-12-23 MED ORDER — ROCURONIUM BROMIDE 10 MG/ML (PF) SYRINGE
PREFILLED_SYRINGE | INTRAVENOUS | Status: DC | PRN
  Administered 2021-12-23: 70 mg via INTRAVENOUS

## 2021-12-23 MED ORDER — 0.9 % SODIUM CHLORIDE (POUR BTL) OPTIME
TOPICAL | Status: DC | PRN
  Administered 2021-12-23: 1000 mL

## 2021-12-23 MED ORDER — OXYCODONE HCL 5 MG/5ML PO SOLN: 5.0000 mg | Freq: Once | ORAL | Status: AC | PRN

## 2021-12-23 MED ORDER — LACTATED RINGERS IV SOLN
INTRAVENOUS | Status: DC
Start: 2021-12-23 — End: 2021-12-23

## 2021-12-23 MED ORDER — TRAMADOL HCL 50 MG PO TABS
ORAL_TABLET | ORAL | 0 refills | Status: AC
Start: 2021-12-23 — End: ?
  Filled 2021-12-23: qty 20, 5d supply, fill #0

## 2021-12-23 MED ORDER — PHENYLEPHRINE HCL-NACL 20-0.9 MG/250ML-% IV SOLN
INTRAVENOUS | Status: DC | PRN
  Administered 2021-12-23: 25 ug/min via INTRAVENOUS

## 2021-12-23 MED ORDER — OXYCODONE HCL 5 MG PO TABS
ORAL_TABLET | ORAL | Status: AC
  Filled 2021-12-23: qty 1

## 2021-12-23 MED ORDER — FENTANYL CITRATE (PF) 100 MCG/2ML IJ SOLN
INTRAMUSCULAR | Status: DC | PRN
  Administered 2021-12-23 (×2): 25 ug via INTRAVENOUS
  Administered 2021-12-23: 50 ug via INTRAVENOUS

## 2021-12-23 MED ORDER — SUGAMMADEX SODIUM 200 MG/2ML IV SOLN
INTRAVENOUS | Status: DC | PRN
  Administered 2021-12-23: 200 mg via INTRAVENOUS

## 2021-12-23 MED ORDER — DEXMEDETOMIDINE (PRECEDEX) IN NS 20 MCG/5ML (4 MCG/ML) IV SYRINGE
PREFILLED_SYRINGE | INTRAVENOUS | Status: DC | PRN
  Administered 2021-12-23: 16 ug via INTRAVENOUS

## 2021-12-23 SURGICAL SUPPLY — 50 items
ADH SKN CLS APL DERMABOND .7 (GAUZE/BANDAGES/DRESSINGS) ×2
APL PRP STRL LF DISP 70% ISPRP (MISCELLANEOUS) ×2
APPLIER CLIP 5 13 M/L LIGAMAX5 (MISCELLANEOUS)
APR CLP MED LRG 5 ANG JAW (MISCELLANEOUS)
BAG COUNTER SPONGE SURGICOUNT (BAG) IMPLANT
BAG SPNG CNTER NS LX DISP (BAG)
BINDER ABDOMINAL 12 ML 46-62 (SOFTGOODS) IMPLANT
CABLE HIGH FREQUENCY MONO STRZ (ELECTRODE) ×2 IMPLANT
CHLORAPREP W/TINT 26 (MISCELLANEOUS) ×2 IMPLANT
CLIP APPLIE 5 13 M/L LIGAMAX5 (MISCELLANEOUS) IMPLANT
DEFOGGER SCOPE WARMER CLEARIFY (MISCELLANEOUS) ×2 IMPLANT
DERMABOND ADVANCED (GAUZE/BANDAGES/DRESSINGS) ×2
DERMABOND ADVANCED .7 DNX12 (GAUZE/BANDAGES/DRESSINGS) ×2 IMPLANT
DEVICE SECURE STRAP 25 ABSORB (INSTRUMENTS) IMPLANT
DEVICE TROCAR PUNCTURE CLOSURE (ENDOMECHANICALS) ×2 IMPLANT
DRSG TELFA 3X8 NADH (GAUZE/BANDAGES/DRESSINGS) ×2 IMPLANT
ELECT REM PT RETURN 15FT ADLT (MISCELLANEOUS) ×2 IMPLANT
GLOVE BIO SURGEON STRL SZ7.5 (GLOVE) ×2 IMPLANT
GOWN STRL REUS W/ TWL XL LVL3 (GOWN DISPOSABLE) ×6 IMPLANT
GOWN STRL REUS W/TWL XL LVL3 (GOWN DISPOSABLE) ×6
INSTR BIOPSY MAXCORE 18GX20 (NEEDLE) IMPLANT
IRRIG SUCT STRYKERFLOW 2 WTIP (MISCELLANEOUS)
IRRIGATION SUCT STRKRFLW 2 WTP (MISCELLANEOUS) IMPLANT
KIT BASIN OR (CUSTOM PROCEDURE TRAY) ×2 IMPLANT
KIT TURNOVER KIT A (KITS) IMPLANT
MARKER SKIN DUAL TIP RULER LAB (MISCELLANEOUS) ×2 IMPLANT
MESH VENTRALIGHT ST 4.5IN (Mesh General) IMPLANT
NDL INSUFFLATION 14GA 120MM (NEEDLE) ×2 IMPLANT
NEEDLE INSUFFLATION 14GA 120MM (NEEDLE) ×2 IMPLANT
PAD DRESSING TELFA 3X8 NADH (GAUZE/BANDAGES/DRESSINGS) IMPLANT
PAD POSITIONING PINK XL (MISCELLANEOUS) IMPLANT
PENCIL SMOKE EVACUATOR (MISCELLANEOUS) IMPLANT
POUCH LAPAROSCOPIC INSTRUMENT (MISCELLANEOUS) ×2 IMPLANT
PROTECTOR NERVE ULNAR (MISCELLANEOUS) IMPLANT
SCISSORS LAP 5X35 DISP (ENDOMECHANICALS) ×2 IMPLANT
SET TUBE SMOKE EVAC HIGH FLOW (TUBING) ×2 IMPLANT
SHEARS HARMONIC ACE PLUS 36CM (ENDOMECHANICALS) IMPLANT
SLEEVE Z-THREAD 5X100MM (TROCAR) ×4 IMPLANT
SPIKE FLUID TRANSFER (MISCELLANEOUS) ×2 IMPLANT
SUT CHROMIC 2 0 SH (SUTURE) ×2 IMPLANT
SUT ETHIBOND 0 MO6 C/R (SUTURE) IMPLANT
SUT MNCRL AB 4-0 PS2 18 (SUTURE) ×2 IMPLANT
SUT NOVA NAB GS-21 1 T12 (SUTURE) IMPLANT
SUT PDS AB 0 CT1 36 (SUTURE) IMPLANT
SUT PDS AB 1 CT1 27 (SUTURE) IMPLANT
SUT PROLENE 2 0 KS (SUTURE) ×2 IMPLANT
TAPE CLOTH 4X10 WHT NS (GAUZE/BANDAGES/DRESSINGS) IMPLANT
TOWEL OR 17X26 10 PK STRL BLUE (TOWEL DISPOSABLE) ×2 IMPLANT
TOWEL OR NON WOVEN STRL DISP B (DISPOSABLE) ×2 IMPLANT
TRAY LAPAROSCOPIC (CUSTOM PROCEDURE TRAY) ×2 IMPLANT

## 2021-12-23 NOTE — Anesthesia Postprocedure Evaluation (Signed)
Anesthesia Post Note  Patient: Jay Porter  Procedure(s) Performed: LAPAROSCOPIC VENTRAL HERNIA WITH MESH DIAGNOSTIC LAPAROSCOPIC LIVER BIOPSY     Patient location during evaluation: PACU Anesthesia Type: General Level of consciousness: awake and alert, oriented and patient cooperative Pain management: pain level controlled Vital Signs Assessment: post-procedure vital signs reviewed and stable Respiratory status: spontaneous breathing, nonlabored ventilation and respiratory function stable Cardiovascular status: blood pressure returned to baseline and stable Postop Assessment: no apparent nausea or vomiting Anesthetic complications: no   No notable events documented.  Last Vitals:  Vitals:   12/23/21 1645 12/23/21 1700  BP: 105/60 111/68  Pulse: 64 66  Resp: 14 13  Temp:    SpO2: 97% 98%    Last Pain:  Vitals:   12/23/21 1700  TempSrc:   PainSc: 0-No pain                 Pervis Hocking

## 2021-12-23 NOTE — Anesthesia Procedure Notes (Signed)
Procedure Name: Intubation Date/Time: 12/23/2021 3:02 PM  Performed by: Milford Cage, CRNAPre-anesthesia Checklist: Patient identified, Emergency Drugs available, Suction available and Patient being monitored Patient Re-evaluated:Patient Re-evaluated prior to induction Oxygen Delivery Method: Circle system utilized Preoxygenation: Pre-oxygenation with 100% oxygen Induction Type: IV induction Ventilation: Mask ventilation without difficulty Laryngoscope Size: Miller and 2 Grade View: Grade I Tube type: Oral Tube size: 7.5 mm Number of attempts: 1 Airway Equipment and Method: Stylet Placement Confirmation: ETT inserted through vocal cords under direct vision, positive ETCO2 and breath sounds checked- equal and bilateral Secured at: 21 cm Tube secured with: Tape Dental Injury: Teeth and Oropharynx as per pre-operative assessment

## 2021-12-23 NOTE — Op Note (Signed)
12/23/2021  3:32 PM  PATIENT:  Jay Porter  76 y.o. male  PRE-OPERATIVE DIAGNOSIS:  VENTRAL HERNIA  POST-OPERATIVE DIAGNOSIS:  VENTRAL HERNIA  PROCEDURE:  Procedure(s): LAPAROSCOPIC VENTRAL HERNIA WITH MESH (N/A) DIAGNOSTIC LAPAROSCOPIC LIVER BIOPSY  SURGEON:  Surgeon(s) and Role:    Ralene Ok, MD - Primary  ASSISTANTS: none   ANESTHESIA:   local and general  EBL:  minimal   BLOOD ADMINISTERED:none  DRAINS: none   LOCAL MEDICATIONS USED:  BUPIVICAINE   SPECIMEN:  Source of Specimen:  liver biopsy  DISPOSITION OF SPECIMEN:  PATHOLOGY  COUNTS:  YES  TOURNIQUET:  * No tourniquets in log *  DICTATION: .Dragon Dictation Findings: 2cm ventral hernia and liver irregularity   Details of the procedure:  After the patient was consented patient was taken back to the operating room patient was then placed in supine position bilateral SCDs in place.  The patient was prepped and draped in the usual sterile fashion. After antibiotics were confirmed a timeout was called and all facts were verified. The Veress needle technique was used to insuflate the abdomen at Palmer's point. The abdomen was insufflated to 14 mm mercury. Subsequently a 5 mm trocar was placed a camera inserted there was no injury to any intra-abdominal organs.    There was seen to be an non-incarcerated  2cm ventral hernia.  A second camera port was in placed into the left lower quadrant.   At this the Falicform ligament was taken down with Bovie cautery maintaining hemostasis.   I proceeded to reduce the hernia contents.  The hernia sac was dissected out of the hernia and disposed.  The fascia at the hernia was reapproximated using a #1Novafil x 2.  Once the hernia was cleared away, a Bard Ventralight 11.4cm  mesh was inserted into the abdomen.  The mesh was secured circumferentially with am Securestrap tacker in a double crown fashion.    The omentum was brought over the area of the mesh.   He had  some liver irregularity.  Photos were taken and placed in his chart.  A core needle bx was taken x 2.  Cautery was used for hemostasis.  The pneumoperitoneum was evacuated  & all trocars  were removed. The skin was reapproximated with 4-0  Monocryl sutures in a subcuticular fashion. The skin was dressed with Dermabond.  The patient was taken to the recovery room in stable condition.  Type of repair -primary suture & mesh  Mesh overlap - 4cm  Placement of mesh -  beneath fascia and into peritoneal cavity  Size: 3cm, Primary Hernia, and Reducible Hernia    PLAN OF CARE: Discharge to home after PACU  PATIENT DISPOSITION:  PACU - hemodynamically stable.   Delay start of Pharmacological VTE agent (>24hrs) due to surgical blood loss or risk of bleeding: not applicable

## 2021-12-23 NOTE — Interval H&P Note (Signed)
History and Physical Interval Note:  12/23/2021 2:37 PM  Jay Porter  has presented today for surgery, with the diagnosis of VENTRAL HERNIA.  The various methods of treatment have been discussed with the patient and family. After consideration of risks, benefits and other options for treatment, the patient has consented to  Procedure(s): Starr (N/A) as a surgical intervention.  The patient's history has been reviewed, patient examined, no change in status, stable for surgery.  I have reviewed the patient's chart and labs.  Questions were answered to the patient's satisfaction.     Ralene Ok

## 2021-12-23 NOTE — Anesthesia Preprocedure Evaluation (Addendum)
Anesthesia Evaluation  Patient identified by MRN, date of birth, ID band Patient awake    Reviewed: Allergy & Precautions, NPO status , Patient's Chart, lab work & pertinent test results  Airway Mallampati: III  TM Distance: >3 FB Neck ROM: Full    Dental  (+) Edentulous Upper, Edentulous Lower   Pulmonary former smoker,  Quit smoking 1970   Pulmonary exam normal breath sounds clear to auscultation       Cardiovascular hypertension (159/74 in preop, per pt normally 115-135- missing morning dose of irbesartan), Pt. on medications Normal cardiovascular exam Rhythm:Regular Rate:Normal     Neuro/Psych negative neurological ROS  negative psych ROS   GI/Hepatic Neg liver ROS, Ventral hernia    Endo/Other  diabetes, Well Controlled, Type 2, Oral Hypoglycemic AgentsHypothyroidism   Renal/GU negative Renal ROS  negative genitourinary   Musculoskeletal  (+) Arthritis , Osteoarthritis,    Abdominal   Peds  Hematology negative hematology ROS (+) Chronic thrombocytopenia- usually around 150-200, but requires manual count   Anesthesia Other Findings   Reproductive/Obstetrics negative OB ROS                            Anesthesia Physical Anesthesia Plan  ASA: 2  Anesthesia Plan: General   Post-op Pain Management: Tylenol PO (pre-op)*   Induction: Intravenous  PONV Risk Score and Plan: Ondansetron, Dexamethasone, Midazolam and Treatment may vary due to age or medical condition  Airway Management Planned: Oral ETT  Additional Equipment: None  Intra-op Plan:   Post-operative Plan: Extubation in OR  Informed Consent: I have reviewed the patients History and Physical, chart, labs and discussed the procedure including the risks, benefits and alternatives for the proposed anesthesia with the patient or authorized representative who has indicated his/her understanding and acceptance.     Dental  advisory given  Plan Discussed with: CRNA  Anesthesia Plan Comments:        Anesthesia Quick Evaluation

## 2021-12-23 NOTE — Transfer of Care (Signed)
Immediate Anesthesia Transfer of Care Note  Patient: Jay Porter  Procedure(s) Performed: LAPAROSCOPIC VENTRAL HERNIA WITH MESH DIAGNOSTIC LAPAROSCOPIC LIVER BIOPSY  Patient Location: PACU  Anesthesia Type:General  Level of Consciousness: awake  Airway & Oxygen Therapy: Patient Spontanous Breathing and Patient connected to face mask oxygen  Post-op Assessment: Report given to RN and Post -op Vital signs reviewed and stable  Post vital signs: Reviewed and stable  Last Vitals:  Vitals Value Taken Time  BP 123/64 12/23/21 1551  Temp    Pulse 71 12/23/21 1552  Resp    SpO2 100 % 12/23/21 1552  Vitals shown include unvalidated device data.  Last Pain:  Vitals:   12/23/21 1309  TempSrc:   PainSc: 0-No pain      Patients Stated Pain Goal: 4 (54/56/25 6389)  Complications: No notable events documented.

## 2021-12-23 NOTE — Discharge Instructions (Signed)
[facility] _______[facility]  HERNIA REPAIR: POST OP INSTRUCTIONS  Always review your discharge instruction sheet given to you by the facility where your surgery was performed. IF YOU HAVE DISABILITY OR FAMILY LEAVE FORMS, YOU MUST BRING THEM TO THE OFFICE FOR PROCESSING.   DO NOT GIVE THEM TO YOUR DOCTOR.  1. A  prescription for pain medication may be given to you upon discharge.  Take your pain medication as prescribed, if needed.  If narcotic pain medicine is not needed, then you may take acetaminophen (Tylenol) or ibuprofen (Advil) as needed. 2. Take your usually prescribed medications unless otherwise directed. If you need a refill on your pain medication, please contact your pharmacy.  They will contact our office to request authorization. Prescriptions will not be filled after 5 pm or on week-ends. 3. You should follow a light diet the first 24 hours after arrival home, such as soup and crackers, etc.  Be sure to include lots of fluids daily.  Resume your normal diet the day after surgery. 4.Most patients will experience some swelling and bruising around the umbilicus or in the groin and scrotum.  Ice packs and reclining will help.  Swelling and bruising can take several days to resolve.  6. It is common to experience some constipation if taking pain medication after surgery.  Increasing fluid intake and taking a stool softener (such as Colace) will usually help or prevent this problem from occurring.  A mild laxative (Milk of Magnesia or Miralax) should be taken according to package directions if there are no bowel movements after 48 hours. 7. Unless discharge instructions indicate otherwise, you may remove your bandages 24-48 hours after surgery, and you may shower at that time.  You may have steri-strips (small skin tapes) in place directly over the incision.  These strips should be left on the skin for 7-10 days.  If your surgeon used skin glue on the incision, you may shower in  24 hours.  The glue will flake off over the next 2-3 weeks.  Any sutures or staples will be removed at the office during your follow-up visit. 8. ACTIVITIES:  You may resume regular (light) daily activities beginning the next day--such as daily self-care, walking, climbing stairs--gradually increasing activities as tolerated.  You may have sexual intercourse when it is comfortable.  Refrain from any heavy lifting or straining until approved by your doctor.  a.You may drive when you are no longer taking prescription pain medication, you can comfortably wear a seatbelt, and you can safely maneuver your car and apply brakes. b.RETURN TO WORK:   _____________________________________________  9.You should see your doctor in the office for a follow-up appointment approximately 2-3 weeks after your surgery.  Make sure that you call for this appointment within a day or two after you arrive home to insure a convenient appointment time. 10.OTHER INSTRUCTIONS: _________________________    _____________________________________  WHEN TO CALL YOUR DOCTOR: Fever over 101.0 Inability to urinate Nausea and/or vomiting Extreme swelling or bruising Continued bleeding from incision. Increased pain, redness, or drainage from the incision  The clinic staff is available to answer your questions during regular business hours.  Please don't hesitate to call and ask to speak to one of the nurses for clinical concerns.  If you have a medical emergency, go to the nearest emergency room or call 911.  A surgeon from [facility] is always on call at the hospital   [address], [address], , [place]    [address] ?  [address] [address], [place], [place]   [address] [phone] ? [phone] ? FAX [phone] Web site: [email or web address]  

## 2021-12-24 ENCOUNTER — Encounter (HOSPITAL_COMMUNITY): Payer: Self-pay | Admitting: General Surgery

## 2021-12-24 ENCOUNTER — Other Ambulatory Visit: Payer: Self-pay

## 2021-12-24 LAB — SURGICAL PATHOLOGY

## 2021-12-25 ENCOUNTER — Ambulatory Visit: Payer: Self-pay | Admitting: General Surgery

## 2022-01-28 ENCOUNTER — Other Ambulatory Visit (HOSPITAL_COMMUNITY): Payer: Self-pay

## 2022-01-28 MED ORDER — AMLODIPINE BESYLATE 10 MG PO TABS
10.0000 mg | ORAL_TABLET | Freq: Every day | ORAL | 0 refills | Status: DC
Start: 2022-01-28 — End: 2022-06-10
  Filled 2022-01-28 – 2022-02-16 (×2): qty 90, 90d supply, fill #0

## 2022-01-28 MED ORDER — IRBESARTAN 300 MG PO TABS
300.0000 mg | ORAL_TABLET | Freq: Every day | ORAL | 0 refills | Status: DC
Start: 2022-01-28 — End: 2022-05-14
  Filled 2022-02-16: qty 90, 90d supply, fill #0

## 2022-01-28 MED ORDER — METFORMIN HCL ER 500 MG PO TB24
1000.0000 mg | ORAL_TABLET | Freq: Every day | ORAL | 0 refills | Status: DC
  Filled 2022-01-28 – 2022-02-16 (×2): qty 180, 90d supply, fill #0

## 2022-01-29 ENCOUNTER — Other Ambulatory Visit (HOSPITAL_BASED_OUTPATIENT_CLINIC_OR_DEPARTMENT_OTHER): Payer: Self-pay

## 2022-01-29 MED ORDER — INFLUENZA VAC A&B SA ADJ QUAD 0.5 ML IM PRSY
PREFILLED_SYRINGE | INTRAMUSCULAR | 0 refills | Status: AC
  Filled 2022-01-29: qty 0.5, 1d supply, fill #0

## 2022-02-06 ENCOUNTER — Other Ambulatory Visit (HOSPITAL_BASED_OUTPATIENT_CLINIC_OR_DEPARTMENT_OTHER): Payer: Self-pay

## 2022-02-06 MED ORDER — COVID-19 MRNA 2023-2024 VACCINE (COMIRNATY) 0.3 ML INJECTION
INTRAMUSCULAR | 0 refills | Status: AC
  Filled 2022-02-06: qty 0.3, 1d supply, fill #0

## 2022-02-16 ENCOUNTER — Other Ambulatory Visit (HOSPITAL_COMMUNITY): Payer: Self-pay

## 2022-02-17 ENCOUNTER — Other Ambulatory Visit (HOSPITAL_COMMUNITY): Payer: Self-pay

## 2022-02-18 ENCOUNTER — Other Ambulatory Visit (HOSPITAL_COMMUNITY): Payer: Self-pay

## 2022-02-18 MED ORDER — SILDENAFIL CITRATE 50 MG PO TABS
ORAL_TABLET | ORAL | 3 refills | Status: AC
  Filled 2022-02-18: qty 10, 10d supply, fill #0
  Filled 2022-05-12: qty 10, 75d supply, fill #1
  Filled 2022-07-23: qty 10, 75d supply, fill #2
  Filled 2022-10-06: qty 10, 75d supply, fill #3

## 2022-02-21 ENCOUNTER — Other Ambulatory Visit (HOSPITAL_COMMUNITY): Payer: Self-pay

## 2022-04-15 ENCOUNTER — Other Ambulatory Visit (HOSPITAL_BASED_OUTPATIENT_CLINIC_OR_DEPARTMENT_OTHER): Payer: Self-pay

## 2022-04-15 MED ORDER — AREXVY 120 MCG/0.5ML IM SUSR
INTRAMUSCULAR | 0 refills | Status: AC
  Filled 2022-04-15: qty 0.5, 1d supply, fill #0

## 2022-05-12 ENCOUNTER — Other Ambulatory Visit (HOSPITAL_COMMUNITY): Payer: Self-pay

## 2022-05-13 ENCOUNTER — Other Ambulatory Visit (HOSPITAL_COMMUNITY): Payer: Self-pay

## 2022-05-13 ENCOUNTER — Other Ambulatory Visit: Payer: Self-pay

## 2022-05-13 MED ORDER — LEVOTHYROXINE SODIUM 75 MCG PO TABS
ORAL_TABLET | ORAL | 0 refills | Status: DC
  Filled 2022-05-13: qty 90, 90d supply, fill #0

## 2022-05-14 ENCOUNTER — Other Ambulatory Visit (HOSPITAL_COMMUNITY): Payer: Self-pay

## 2022-05-14 MED ORDER — METFORMIN HCL ER 500 MG PO TB24
1000.0000 mg | ORAL_TABLET | Freq: Every day | ORAL | 0 refills | Status: DC
Start: 2022-05-14 — End: 2022-08-12
  Filled 2022-05-14: qty 180, 90d supply, fill #0

## 2022-05-14 MED ORDER — AMLODIPINE BESYLATE 10 MG PO TABS
10.0000 mg | ORAL_TABLET | Freq: Every day | ORAL | 0 refills | Status: AC
  Filled 2022-05-14: qty 90, 90d supply, fill #0

## 2022-05-14 MED ORDER — IRBESARTAN 300 MG PO TABS
300.0000 mg | ORAL_TABLET | Freq: Every day | ORAL | 0 refills | Status: DC
Start: 2022-05-14 — End: 2022-08-12
  Filled 2022-05-14: qty 90, 90d supply, fill #0

## 2022-06-07 ENCOUNTER — Other Ambulatory Visit (HOSPITAL_COMMUNITY): Payer: Self-pay

## 2022-06-10 ENCOUNTER — Other Ambulatory Visit (HOSPITAL_COMMUNITY): Payer: Self-pay

## 2022-06-10 MED ORDER — AMLODIPINE BESYLATE 10 MG PO TABS
10.0000 mg | ORAL_TABLET | Freq: Every day | ORAL | 0 refills | Status: AC
  Filled 2022-06-10 – 2022-08-12 (×2): qty 90, 90d supply, fill #0

## 2022-06-12 ENCOUNTER — Other Ambulatory Visit (HOSPITAL_COMMUNITY): Payer: Self-pay

## 2022-06-26 ENCOUNTER — Other Ambulatory Visit (HOSPITAL_COMMUNITY): Payer: Self-pay

## 2022-06-26 MED ORDER — CICLOPIROX 8 % EX SOLN
CUTANEOUS | 5 refills | Status: AC
  Filled 2022-06-26: qty 6.6, 30d supply, fill #0
  Filled 2022-07-23: qty 6.6, 30d supply, fill #1
  Filled 2023-05-06: qty 6.6, 30d supply, fill #2

## 2022-07-17 ENCOUNTER — Other Ambulatory Visit (HOSPITAL_BASED_OUTPATIENT_CLINIC_OR_DEPARTMENT_OTHER): Payer: Self-pay

## 2022-07-17 MED ORDER — COVID-19 MRNA VAC-TRIS(PFIZER) 30 MCG/0.3ML IM SUSY
0.3000 mL | PREFILLED_SYRINGE | Freq: Once | INTRAMUSCULAR | 0 refills | Status: AC
  Filled 2022-07-17: qty 0.3, 1d supply, fill #0

## 2022-08-05 ENCOUNTER — Other Ambulatory Visit (HOSPITAL_COMMUNITY): Payer: Self-pay

## 2022-08-06 ENCOUNTER — Other Ambulatory Visit (HOSPITAL_COMMUNITY): Payer: Self-pay

## 2022-08-06 MED ORDER — LEVOTHYROXINE SODIUM 75 MCG PO TABS
ORAL_TABLET | ORAL | 0 refills | Status: DC
  Filled 2022-08-06: qty 90, 90d supply, fill #0

## 2022-08-08 ENCOUNTER — Other Ambulatory Visit (HOSPITAL_COMMUNITY): Payer: Self-pay

## 2022-08-12 ENCOUNTER — Other Ambulatory Visit (HOSPITAL_COMMUNITY): Payer: Self-pay

## 2022-08-12 MED ORDER — AMLODIPINE BESYLATE 10 MG PO TABS
10.0000 mg | ORAL_TABLET | Freq: Every day | ORAL | 0 refills | Status: AC
  Filled 2022-08-12: qty 90, 90d supply, fill #0

## 2022-08-12 MED ORDER — IRBESARTAN 300 MG PO TABS
300.0000 mg | ORAL_TABLET | Freq: Every day | ORAL | 0 refills | Status: DC
  Filled 2022-08-12: qty 90, 90d supply, fill #0

## 2022-08-12 MED ORDER — METFORMIN HCL ER 500 MG PO TB24
1000.0000 mg | ORAL_TABLET | Freq: Every day | ORAL | 0 refills | Status: DC
  Filled 2022-08-12: qty 180, 90d supply, fill #0

## 2022-08-14 ENCOUNTER — Other Ambulatory Visit (HOSPITAL_COMMUNITY): Payer: Self-pay

## 2022-08-14 ENCOUNTER — Other Ambulatory Visit: Payer: Self-pay

## 2022-09-11 ENCOUNTER — Other Ambulatory Visit (HOSPITAL_COMMUNITY): Payer: Self-pay

## 2022-09-11 ENCOUNTER — Other Ambulatory Visit: Payer: Self-pay

## 2022-09-11 MED ORDER — AMLODIPINE BESYLATE 10 MG PO TABS
10.0000 mg | ORAL_TABLET | Freq: Every day | ORAL | 0 refills | Status: AC
  Filled 2022-11-05: qty 90, 90d supply, fill #0

## 2022-09-11 MED ORDER — METFORMIN HCL ER 500 MG PO TB24
1000.0000 mg | ORAL_TABLET | Freq: Every day | ORAL | 0 refills | Status: DC
  Filled 2022-10-06: qty 180, fill #0
  Filled 2022-11-05: qty 180, 90d supply, fill #0

## 2022-09-11 MED ORDER — ROSUVASTATIN CALCIUM 10 MG PO TABS
10.0000 mg | ORAL_TABLET | Freq: Every day | ORAL | 0 refills | Status: DC
  Filled 2022-12-09: qty 90, 90d supply, fill #0

## 2022-09-11 MED ORDER — IRBESARTAN 300 MG PO TABS
300.0000 mg | ORAL_TABLET | Freq: Every day | ORAL | 0 refills | Status: DC
  Filled 2022-11-05: qty 90, 90d supply, fill #0

## 2022-09-11 MED ORDER — LEVOTHYROXINE SODIUM 75 MCG PO TABS
75.0000 ug | ORAL_TABLET | Freq: Every morning | ORAL | 0 refills | Status: DC
  Filled 2022-11-05: qty 90, 90d supply, fill #0

## 2022-10-06 ENCOUNTER — Other Ambulatory Visit: Payer: Self-pay

## 2022-10-07 ENCOUNTER — Other Ambulatory Visit (HOSPITAL_COMMUNITY): Payer: Self-pay

## 2022-11-01 ENCOUNTER — Other Ambulatory Visit (HOSPITAL_BASED_OUTPATIENT_CLINIC_OR_DEPARTMENT_OTHER): Payer: Self-pay

## 2022-11-03 ENCOUNTER — Other Ambulatory Visit (HOSPITAL_BASED_OUTPATIENT_CLINIC_OR_DEPARTMENT_OTHER): Payer: Self-pay

## 2022-11-05 ENCOUNTER — Other Ambulatory Visit (HOSPITAL_COMMUNITY): Payer: Self-pay

## 2022-11-05 ENCOUNTER — Other Ambulatory Visit: Payer: Self-pay

## 2022-11-06 ENCOUNTER — Other Ambulatory Visit (HOSPITAL_COMMUNITY): Payer: Self-pay

## 2022-11-06 MED ORDER — AMLODIPINE BESYLATE 10 MG PO TABS
10.0000 mg | ORAL_TABLET | Freq: Every day | ORAL | 0 refills | Status: DC
  Filled 2022-11-06: qty 90, 90d supply, fill #0

## 2022-11-06 MED ORDER — AMLODIPINE BESYLATE 10 MG PO TABS
10.0000 mg | ORAL_TABLET | Freq: Every day | ORAL | 0 refills | Status: DC
  Filled 2022-11-06 – 2023-05-06 (×2): qty 90, 90d supply, fill #0

## 2022-11-07 ENCOUNTER — Other Ambulatory Visit (HOSPITAL_COMMUNITY): Payer: Self-pay

## 2022-12-03 ENCOUNTER — Other Ambulatory Visit (HOSPITAL_BASED_OUTPATIENT_CLINIC_OR_DEPARTMENT_OTHER): Payer: Self-pay

## 2022-12-09 ENCOUNTER — Other Ambulatory Visit (HOSPITAL_COMMUNITY): Payer: Self-pay

## 2022-12-11 ENCOUNTER — Other Ambulatory Visit (HOSPITAL_COMMUNITY): Payer: Self-pay

## 2022-12-16 ENCOUNTER — Other Ambulatory Visit (HOSPITAL_COMMUNITY): Payer: Self-pay

## 2022-12-18 ENCOUNTER — Other Ambulatory Visit (HOSPITAL_COMMUNITY): Payer: Self-pay

## 2022-12-18 MED ORDER — TADALAFIL 10 MG PO TABS
ORAL_TABLET | ORAL | 1 refills | Status: DC
  Filled 2022-12-18: qty 10, 10d supply, fill #0
  Filled 2022-12-25 – 2022-12-26 (×2): qty 10, 10d supply, fill #1

## 2022-12-19 ENCOUNTER — Other Ambulatory Visit (HOSPITAL_COMMUNITY): Payer: Self-pay

## 2022-12-25 ENCOUNTER — Other Ambulatory Visit (HOSPITAL_COMMUNITY): Payer: Self-pay

## 2022-12-26 ENCOUNTER — Other Ambulatory Visit (HOSPITAL_COMMUNITY): Payer: Self-pay

## 2023-02-02 ENCOUNTER — Other Ambulatory Visit (HOSPITAL_COMMUNITY): Payer: Self-pay

## 2023-02-02 ENCOUNTER — Other Ambulatory Visit: Payer: Self-pay

## 2023-02-02 MED ORDER — ROSUVASTATIN CALCIUM 10 MG PO TABS
10.0000 mg | ORAL_TABLET | Freq: Every day | ORAL | 0 refills | Status: AC
  Filled 2023-02-02: qty 90, 90d supply, fill #0

## 2023-02-02 MED ORDER — AMLODIPINE BESYLATE 10 MG PO TABS
10.0000 mg | ORAL_TABLET | Freq: Every day | ORAL | 0 refills | Status: DC
  Filled 2023-02-02: qty 90, 90d supply, fill #0

## 2023-02-02 MED ORDER — TADALAFIL 5 MG PO TABS
5.0000 mg | ORAL_TABLET | Freq: Every day | ORAL | 3 refills | Status: DC | PRN
  Filled 2023-02-02: qty 10, 10d supply, fill #0

## 2023-02-02 MED ORDER — IRBESARTAN 300 MG PO TABS
300.0000 mg | ORAL_TABLET | Freq: Every day | ORAL | 0 refills | Status: DC
  Filled 2023-02-02: qty 90, 90d supply, fill #0

## 2023-02-02 MED ORDER — LEVOTHYROXINE SODIUM 75 MCG PO TABS
75.0000 ug | ORAL_TABLET | Freq: Every morning | ORAL | 0 refills | Status: DC
  Filled 2023-02-02: qty 90, 90d supply, fill #0

## 2023-02-02 MED ORDER — METFORMIN HCL ER 500 MG PO TB24
1000.0000 mg | ORAL_TABLET | Freq: Every day | ORAL | 0 refills | Status: DC
  Filled 2023-02-02: qty 180, 90d supply, fill #0

## 2023-02-03 ENCOUNTER — Other Ambulatory Visit (HOSPITAL_COMMUNITY): Payer: Self-pay

## 2023-02-03 MED ORDER — ROSUVASTATIN CALCIUM 10 MG PO TABS
10.0000 mg | ORAL_TABLET | Freq: Every day | ORAL | 2 refills | Status: DC
  Filled 2023-02-03 – 2023-03-06 (×2): qty 90, 90d supply, fill #0
  Filled 2023-06-15: qty 90, 90d supply, fill #1
  Filled 2023-09-16: qty 90, 90d supply, fill #2

## 2023-03-06 ENCOUNTER — Other Ambulatory Visit (HOSPITAL_COMMUNITY): Payer: Self-pay

## 2023-03-06 ENCOUNTER — Other Ambulatory Visit: Payer: Self-pay

## 2023-03-09 ENCOUNTER — Other Ambulatory Visit (HOSPITAL_COMMUNITY): Payer: Self-pay

## 2023-03-10 ENCOUNTER — Other Ambulatory Visit (HOSPITAL_COMMUNITY): Payer: Self-pay

## 2023-03-10 MED ORDER — TADALAFIL 10 MG PO TABS
ORAL_TABLET | ORAL | 1 refills | Status: DC
Start: 2023-03-10 — End: 2023-08-02
  Filled 2023-03-10: qty 10, 10d supply, fill #0
  Filled 2023-07-28: qty 10, 10d supply, fill #1

## 2023-03-11 ENCOUNTER — Other Ambulatory Visit (HOSPITAL_COMMUNITY): Payer: Self-pay

## 2023-03-13 ENCOUNTER — Other Ambulatory Visit (HOSPITAL_COMMUNITY): Payer: Self-pay

## 2023-04-27 ENCOUNTER — Other Ambulatory Visit (HOSPITAL_BASED_OUTPATIENT_CLINIC_OR_DEPARTMENT_OTHER): Payer: Self-pay

## 2023-04-27 ENCOUNTER — Other Ambulatory Visit (HOSPITAL_COMMUNITY): Payer: Self-pay

## 2023-04-27 MED ORDER — LEVOTHYROXINE SODIUM 75 MCG PO TABS
75.0000 ug | ORAL_TABLET | Freq: Every morning | ORAL | 0 refills | Status: DC
  Filled 2023-04-27: qty 90, 90d supply, fill #0

## 2023-04-27 MED ORDER — METFORMIN HCL ER 500 MG PO TB24
1000.0000 mg | ORAL_TABLET | Freq: Every day | ORAL | 0 refills | Status: DC
  Filled 2023-04-27: qty 180, 90d supply, fill #0

## 2023-04-27 MED ORDER — IRBESARTAN 300 MG PO TABS
300.0000 mg | ORAL_TABLET | Freq: Every day | ORAL | 0 refills | Status: DC
  Filled 2023-04-27: qty 90, 90d supply, fill #0

## 2023-04-28 ENCOUNTER — Other Ambulatory Visit (HOSPITAL_COMMUNITY): Payer: Self-pay

## 2023-04-30 ENCOUNTER — Other Ambulatory Visit (HOSPITAL_COMMUNITY): Payer: Self-pay

## 2023-05-06 ENCOUNTER — Other Ambulatory Visit (HOSPITAL_COMMUNITY): Payer: Self-pay

## 2023-05-06 ENCOUNTER — Other Ambulatory Visit: Payer: Self-pay

## 2023-05-19 ENCOUNTER — Other Ambulatory Visit (HOSPITAL_BASED_OUTPATIENT_CLINIC_OR_DEPARTMENT_OTHER): Payer: Self-pay

## 2023-05-19 MED ORDER — METFORMIN HCL ER 500 MG PO TB24
1000.0000 mg | ORAL_TABLET | Freq: Every day | ORAL | 0 refills | Status: DC
  Filled 2023-05-19 – 2023-07-28 (×2): qty 180, 90d supply, fill #0

## 2023-05-19 MED ORDER — IRBESARTAN 300 MG PO TABS
300.0000 mg | ORAL_TABLET | Freq: Every day | ORAL | 0 refills | Status: DC
  Filled 2023-05-19 – 2023-07-28 (×2): qty 90, 90d supply, fill #0

## 2023-05-19 MED ORDER — AMLODIPINE BESYLATE 10 MG PO TABS
10.0000 mg | ORAL_TABLET | Freq: Every day | ORAL | 0 refills | Status: DC
  Filled 2023-05-19 – 2023-07-28 (×2): qty 90, 90d supply, fill #0

## 2023-05-20 ENCOUNTER — Other Ambulatory Visit (HOSPITAL_BASED_OUTPATIENT_CLINIC_OR_DEPARTMENT_OTHER): Payer: Self-pay

## 2023-07-23 ENCOUNTER — Other Ambulatory Visit (HOSPITAL_COMMUNITY): Payer: Self-pay

## 2023-07-23 MED ORDER — TAMSULOSIN HCL 0.4 MG PO CAPS
0.4000 mg | ORAL_CAPSULE | Freq: Every day | ORAL | 3 refills | Status: AC
  Filled 2023-07-23 – 2023-10-21 (×2): qty 90, 90d supply, fill #0

## 2023-07-28 ENCOUNTER — Other Ambulatory Visit (HOSPITAL_BASED_OUTPATIENT_CLINIC_OR_DEPARTMENT_OTHER): Payer: Self-pay

## 2023-07-28 MED ORDER — LEVOTHYROXINE SODIUM 75 MCG PO TABS
75.0000 ug | ORAL_TABLET | Freq: Every morning | ORAL | 0 refills | Status: DC
  Filled 2023-07-28: qty 90, 90d supply, fill #0

## 2023-07-29 ENCOUNTER — Other Ambulatory Visit (HOSPITAL_COMMUNITY): Payer: Self-pay

## 2023-07-29 ENCOUNTER — Other Ambulatory Visit (HOSPITAL_BASED_OUTPATIENT_CLINIC_OR_DEPARTMENT_OTHER): Payer: Self-pay

## 2023-07-29 ENCOUNTER — Other Ambulatory Visit: Payer: Self-pay

## 2023-08-01 ENCOUNTER — Other Ambulatory Visit (HOSPITAL_BASED_OUTPATIENT_CLINIC_OR_DEPARTMENT_OTHER): Payer: Self-pay

## 2023-08-02 ENCOUNTER — Other Ambulatory Visit (HOSPITAL_BASED_OUTPATIENT_CLINIC_OR_DEPARTMENT_OTHER): Payer: Self-pay

## 2023-08-02 MED ORDER — LEVOTHYROXINE SODIUM 75 MCG PO TABS
75.0000 ug | ORAL_TABLET | Freq: Every morning | ORAL | 0 refills | Status: DC
  Filled 2023-10-21: qty 90, 90d supply, fill #0

## 2023-08-02 MED ORDER — TADALAFIL 10 MG PO TABS
10.0000 mg | ORAL_TABLET | ORAL | 1 refills | Status: DC | PRN
  Filled 2023-10-21: qty 10, 10d supply, fill #0
  Filled 2024-01-14: qty 10, 10d supply, fill #1

## 2023-08-03 ENCOUNTER — Other Ambulatory Visit (HOSPITAL_BASED_OUTPATIENT_CLINIC_OR_DEPARTMENT_OTHER): Payer: Self-pay

## 2023-09-24 ENCOUNTER — Other Ambulatory Visit (HOSPITAL_BASED_OUTPATIENT_CLINIC_OR_DEPARTMENT_OTHER): Payer: Self-pay

## 2023-09-24 MED ORDER — COMIRNATY 30 MCG/0.3ML IM SUSY
PREFILLED_SYRINGE | INTRAMUSCULAR | 0 refills | Status: AC
  Filled 2023-09-24: qty 0.3, 1d supply, fill #0

## 2023-09-25 ENCOUNTER — Other Ambulatory Visit (HOSPITAL_BASED_OUTPATIENT_CLINIC_OR_DEPARTMENT_OTHER): Payer: Self-pay

## 2023-09-28 ENCOUNTER — Other Ambulatory Visit (HOSPITAL_BASED_OUTPATIENT_CLINIC_OR_DEPARTMENT_OTHER): Payer: Self-pay

## 2023-09-28 MED ORDER — KETOCONAZOLE 2 % EX SHAM
1.0000 | MEDICATED_SHAMPOO | CUTANEOUS | 11 refills | Status: AC
  Filled 2023-09-28: qty 120, 30d supply, fill #0
  Filled 2023-10-24: qty 120, 30d supply, fill #1
  Filled 2024-01-14: qty 120, 30d supply, fill #2
  Filled 2024-02-14: qty 120, 30d supply, fill #3
  Filled 2024-03-12: qty 120, 30d supply, fill #4

## 2023-09-28 MED ORDER — CLOBETASOL PROPIONATE 0.05 % EX SOLN
1.0000 | Freq: Two times a day (BID) | CUTANEOUS | 11 refills | Status: AC
  Filled 2023-09-28: qty 50, 50d supply, fill #0
  Filled 2023-11-13: qty 50, 50d supply, fill #1
  Filled 2024-01-14: qty 50, 50d supply, fill #2
  Filled 2024-02-14 – 2024-02-22 (×4): qty 50, 50d supply, fill #3

## 2023-09-28 MED ORDER — HYDROCORTISONE 2.5 % EX CREA
1.0000 | TOPICAL_CREAM | Freq: Two times a day (BID) | CUTANEOUS | 4 refills | Status: AC
  Filled 2023-09-28: qty 30, 30d supply, fill #0
  Filled 2023-10-24: qty 30, 30d supply, fill #1
  Filled 2024-01-14: qty 30, 30d supply, fill #2
  Filled 2024-02-14: qty 30, 30d supply, fill #3
  Filled 2024-03-12: qty 30, 30d supply, fill #4

## 2023-10-22 ENCOUNTER — Other Ambulatory Visit (HOSPITAL_BASED_OUTPATIENT_CLINIC_OR_DEPARTMENT_OTHER): Payer: Self-pay

## 2023-10-24 ENCOUNTER — Other Ambulatory Visit (HOSPITAL_BASED_OUTPATIENT_CLINIC_OR_DEPARTMENT_OTHER): Payer: Self-pay

## 2023-10-26 ENCOUNTER — Other Ambulatory Visit: Payer: Self-pay

## 2023-10-26 ENCOUNTER — Other Ambulatory Visit (HOSPITAL_BASED_OUTPATIENT_CLINIC_OR_DEPARTMENT_OTHER): Payer: Self-pay

## 2023-10-26 MED ORDER — METFORMIN HCL ER 500 MG PO TB24
ORAL_TABLET | ORAL | 0 refills | Status: DC
  Filled 2023-10-26: qty 180, 90d supply, fill #0

## 2023-10-26 MED ORDER — IRBESARTAN 300 MG PO TABS
300.0000 mg | ORAL_TABLET | Freq: Every day | ORAL | 0 refills | Status: DC
  Filled 2023-10-26: qty 90, 90d supply, fill #0

## 2023-10-26 MED ORDER — AMLODIPINE BESYLATE 10 MG PO TABS
10.0000 mg | ORAL_TABLET | Freq: Every day | ORAL | 0 refills | Status: DC
  Filled 2023-10-26: qty 90, 90d supply, fill #0

## 2023-10-28 ENCOUNTER — Other Ambulatory Visit (HOSPITAL_BASED_OUTPATIENT_CLINIC_OR_DEPARTMENT_OTHER): Payer: Self-pay

## 2023-11-17 ENCOUNTER — Other Ambulatory Visit (HOSPITAL_BASED_OUTPATIENT_CLINIC_OR_DEPARTMENT_OTHER): Payer: Self-pay

## 2023-11-17 MED ORDER — METFORMIN HCL ER 500 MG PO TB24
1000.0000 mg | ORAL_TABLET | Freq: Every day | ORAL | 0 refills | Status: DC
  Filled 2024-01-14: qty 180, 90d supply, fill #0

## 2023-11-17 MED ORDER — AMLODIPINE BESYLATE 10 MG PO TABS
10.0000 mg | ORAL_TABLET | Freq: Every day | ORAL | 0 refills | Status: AC
  Filled 2024-01-27: qty 90, 90d supply, fill #0

## 2023-11-17 MED ORDER — IRBESARTAN 300 MG PO TABS
300.0000 mg | ORAL_TABLET | Freq: Every day | ORAL | 0 refills | Status: DC
  Filled 2024-01-21: qty 90, 90d supply, fill #0

## 2023-12-07 ENCOUNTER — Other Ambulatory Visit (HOSPITAL_BASED_OUTPATIENT_CLINIC_OR_DEPARTMENT_OTHER): Payer: Self-pay

## 2023-12-07 MED ORDER — ROSUVASTATIN CALCIUM 10 MG PO TABS
10.0000 mg | ORAL_TABLET | Freq: Every day | ORAL | 0 refills | Status: DC
  Filled 2023-12-07: qty 90, 90d supply, fill #0

## 2024-01-08 ENCOUNTER — Other Ambulatory Visit (HOSPITAL_BASED_OUTPATIENT_CLINIC_OR_DEPARTMENT_OTHER): Payer: Self-pay

## 2024-01-08 MED ORDER — NA SULFATE-K SULFATE-MG SULF 17.5-3.13-1.6 GM/177ML PO SOLN
177.0000 mL | Freq: Two times a day (BID) | ORAL | 0 refills | Status: AC
  Filled 2024-01-08: qty 354, 1d supply, fill #0

## 2024-01-14 ENCOUNTER — Other Ambulatory Visit (HOSPITAL_BASED_OUTPATIENT_CLINIC_OR_DEPARTMENT_OTHER): Payer: Self-pay

## 2024-01-14 ENCOUNTER — Other Ambulatory Visit: Payer: Self-pay

## 2024-01-14 MED ORDER — LEVOTHYROXINE SODIUM 75 MCG PO TABS
75.0000 ug | ORAL_TABLET | Freq: Every morning | ORAL | 0 refills | Status: DC
  Filled 2024-01-14: qty 90, 90d supply, fill #0

## 2024-01-15 ENCOUNTER — Other Ambulatory Visit: Payer: Self-pay

## 2024-01-21 ENCOUNTER — Other Ambulatory Visit (HOSPITAL_BASED_OUTPATIENT_CLINIC_OR_DEPARTMENT_OTHER): Payer: Self-pay

## 2024-01-22 ENCOUNTER — Other Ambulatory Visit (HOSPITAL_BASED_OUTPATIENT_CLINIC_OR_DEPARTMENT_OTHER): Payer: Self-pay

## 2024-01-27 ENCOUNTER — Other Ambulatory Visit (HOSPITAL_BASED_OUTPATIENT_CLINIC_OR_DEPARTMENT_OTHER): Payer: Self-pay

## 2024-01-27 ENCOUNTER — Other Ambulatory Visit: Payer: Self-pay

## 2024-01-28 ENCOUNTER — Other Ambulatory Visit (HOSPITAL_BASED_OUTPATIENT_CLINIC_OR_DEPARTMENT_OTHER): Payer: Self-pay

## 2024-01-28 MED ORDER — FLUZONE HIGH-DOSE 0.5 ML IM SUSY
0.5000 mL | PREFILLED_SYRINGE | Freq: Once | INTRAMUSCULAR | 0 refills | Status: AC
  Filled 2024-01-28: qty 0.5, 1d supply, fill #0

## 2024-02-15 ENCOUNTER — Other Ambulatory Visit (HOSPITAL_BASED_OUTPATIENT_CLINIC_OR_DEPARTMENT_OTHER): Payer: Self-pay

## 2024-02-16 ENCOUNTER — Other Ambulatory Visit (HOSPITAL_BASED_OUTPATIENT_CLINIC_OR_DEPARTMENT_OTHER): Payer: Self-pay

## 2024-02-22 ENCOUNTER — Other Ambulatory Visit: Payer: Self-pay

## 2024-03-10 ENCOUNTER — Other Ambulatory Visit (HOSPITAL_BASED_OUTPATIENT_CLINIC_OR_DEPARTMENT_OTHER): Payer: Self-pay

## 2024-03-14 ENCOUNTER — Other Ambulatory Visit (HOSPITAL_BASED_OUTPATIENT_CLINIC_OR_DEPARTMENT_OTHER): Payer: Self-pay

## 2024-03-14 ENCOUNTER — Other Ambulatory Visit: Payer: Self-pay

## 2024-03-14 MED ORDER — LEVOTHYROXINE SODIUM 75 MCG PO TABS
75.0000 ug | ORAL_TABLET | Freq: Every morning | ORAL | 0 refills | Status: AC
  Filled 2024-03-14 – 2024-04-09 (×3): qty 90, 90d supply, fill #0

## 2024-03-14 MED ORDER — AMLODIPINE BESYLATE 10 MG PO TABS
10.0000 mg | ORAL_TABLET | Freq: Every day | ORAL | 0 refills | Status: AC
  Filled 2024-03-14: qty 90, 90d supply, fill #0

## 2024-03-14 MED ORDER — IRBESARTAN 300 MG PO TABS
300.0000 mg | ORAL_TABLET | Freq: Every day | ORAL | 0 refills | Status: AC
  Filled 2024-03-14 – 2024-04-27 (×2): qty 90, 90d supply, fill #0

## 2024-03-14 MED ORDER — TADALAFIL 10 MG PO TABS
ORAL_TABLET | ORAL | 1 refills | Status: AC
  Filled 2024-03-14: qty 10, 10d supply, fill #0
  Filled 2024-03-20: qty 10, 10d supply, fill #1

## 2024-03-14 MED ORDER — METFORMIN HCL ER 500 MG PO TB24
1000.0000 mg | ORAL_TABLET | Freq: Every day | ORAL | 0 refills | Status: AC
  Filled 2024-04-09: qty 180, 90d supply, fill #0

## 2024-03-14 MED ORDER — ROSUVASTATIN CALCIUM 10 MG PO TABS
10.0000 mg | ORAL_TABLET | Freq: Every day | ORAL | 0 refills | Status: DC
  Filled 2024-03-14: qty 90, 90d supply, fill #0

## 2024-03-15 ENCOUNTER — Other Ambulatory Visit: Payer: Self-pay

## 2024-03-16 ENCOUNTER — Other Ambulatory Visit (HOSPITAL_BASED_OUTPATIENT_CLINIC_OR_DEPARTMENT_OTHER): Payer: Self-pay

## 2024-03-16 MED ORDER — TADALAFIL 10 MG PO TABS
10.0000 mg | ORAL_TABLET | Freq: Every day | ORAL | 1 refills | Status: AC | PRN
Start: 2024-03-16 — End: ?

## 2024-03-16 MED ORDER — METFORMIN HCL ER 500 MG PO TB24: 1000.0000 mg | ORAL_TABLET | Freq: Every day | ORAL | 0 refills | Status: AC

## 2024-03-16 MED ORDER — IRBESARTAN 300 MG PO TABS
300.0000 mg | ORAL_TABLET | Freq: Every day | ORAL | 0 refills | Status: AC
Start: 2024-03-16 — End: ?

## 2024-03-16 MED ORDER — ROSUVASTATIN CALCIUM 10 MG PO TABS: 10.0000 mg | ORAL_TABLET | Freq: Every day | ORAL | 0 refills | Status: AC

## 2024-03-16 MED ORDER — LEVOTHYROXINE SODIUM 75 MCG PO TABS: 75.0000 ug | ORAL_TABLET | Freq: Every morning | ORAL | 0 refills | Status: AC

## 2024-03-16 MED ORDER — AMLODIPINE BESYLATE 10 MG PO TABS
10.0000 mg | ORAL_TABLET | Freq: Every day | ORAL | 0 refills | Status: AC
  Filled 2024-03-16 – 2024-04-27 (×2): qty 90, 90d supply, fill #0

## 2024-03-17 ENCOUNTER — Other Ambulatory Visit (HOSPITAL_BASED_OUTPATIENT_CLINIC_OR_DEPARTMENT_OTHER): Payer: Self-pay

## 2024-03-17 MED ORDER — ROSUVASTATIN CALCIUM 10 MG PO TABS
10.0000 mg | ORAL_TABLET | Freq: Every day | ORAL | 2 refills | Status: AC
  Filled 2024-03-17 – 2024-04-27 (×2): qty 90, 90d supply, fill #0

## 2024-03-21 ENCOUNTER — Other Ambulatory Visit: Payer: Self-pay

## 2024-03-21 ENCOUNTER — Other Ambulatory Visit (HOSPITAL_BASED_OUTPATIENT_CLINIC_OR_DEPARTMENT_OTHER): Payer: Self-pay

## 2024-03-25 ENCOUNTER — Other Ambulatory Visit (HOSPITAL_BASED_OUTPATIENT_CLINIC_OR_DEPARTMENT_OTHER): Payer: Self-pay

## 2024-03-30 ENCOUNTER — Other Ambulatory Visit: Payer: Self-pay

## 2024-03-30 ENCOUNTER — Other Ambulatory Visit (HOSPITAL_BASED_OUTPATIENT_CLINIC_OR_DEPARTMENT_OTHER): Payer: Self-pay

## 2024-03-30 ENCOUNTER — Other Ambulatory Visit (HOSPITAL_COMMUNITY): Payer: Self-pay

## 2024-03-30 MED ORDER — KETOCONAZOLE 2 % EX SHAM: MEDICATED_SHAMPOO | CUTANEOUS | 11 refills | Status: AC

## 2024-03-30 MED ORDER — CLOBETASOL PROPIONATE 0.05 % EX SOLN
CUTANEOUS | 11 refills | Status: AC
  Filled 2024-03-30: qty 50, 30d supply, fill #0
  Filled 2024-04-26: qty 50, 30d supply, fill #1

## 2024-04-10 ENCOUNTER — Other Ambulatory Visit (HOSPITAL_BASED_OUTPATIENT_CLINIC_OR_DEPARTMENT_OTHER): Payer: Self-pay

## 2024-04-11 ENCOUNTER — Other Ambulatory Visit: Payer: Self-pay

## 2024-04-11 ENCOUNTER — Other Ambulatory Visit (HOSPITAL_BASED_OUTPATIENT_CLINIC_OR_DEPARTMENT_OTHER): Payer: Self-pay

## 2024-04-13 ENCOUNTER — Other Ambulatory Visit (HOSPITAL_BASED_OUTPATIENT_CLINIC_OR_DEPARTMENT_OTHER): Payer: Self-pay

## 2024-04-26 ENCOUNTER — Other Ambulatory Visit: Payer: Self-pay

## 2024-04-27 ENCOUNTER — Other Ambulatory Visit: Payer: Self-pay
